# Patient Record
Sex: Female | Born: 1960
Health system: Southern US, Community
[De-identification: ages and names within clinical notes are randomized; demographics above are authoritative.]

## PROBLEM LIST (undated history)

## (undated) DIAGNOSIS — E0789 Other specified disorders of thyroid: Secondary | ICD-10-CM

## (undated) DIAGNOSIS — L52 Erythema nodosum: Secondary | ICD-10-CM

## (undated) DIAGNOSIS — K219 Gastro-esophageal reflux disease without esophagitis: Secondary | ICD-10-CM

## (undated) HISTORY — DX: Gastro-esophageal reflux disease without esophagitis: K21.9

## (undated) HISTORY — DX: Erythema nodosum: L52

## (undated) HISTORY — DX: Other specified disorders of thyroid: E07.89

## (undated) HISTORY — PX: NO PAST SURGERIES: SHX2092

---

## 2007-11-10 ENCOUNTER — Encounter: Admission: RE | Admit: 2007-11-10 | Discharge: 2007-11-10 | Payer: Self-pay | Admitting: Obstetrics and Gynecology

## 2009-02-28 ENCOUNTER — Ambulatory Visit (HOSPITAL_COMMUNITY): Admission: RE | Admit: 2009-02-28 | Discharge: 2009-02-28 | Payer: Self-pay | Admitting: Family Medicine

## 2010-07-19 ENCOUNTER — Emergency Department (HOSPITAL_COMMUNITY): Admission: EM | Admit: 2010-07-19 | Discharge: 2010-07-19 | Payer: Self-pay | Admitting: Emergency Medicine

## 2013-12-11 ENCOUNTER — Ambulatory Visit (INDEPENDENT_AMBULATORY_CARE_PROVIDER_SITE_OTHER): Payer: Managed Care, Other (non HMO) | Admitting: Family Medicine

## 2013-12-11 ENCOUNTER — Encounter: Payer: Self-pay | Admitting: Family Medicine

## 2013-12-11 VITALS — BP 128/84 | Temp 98.4°F | Ht 65.0 in | Wt 147.0 lb

## 2013-12-11 DIAGNOSIS — R350 Frequency of micturition: Secondary | ICD-10-CM

## 2013-12-11 DIAGNOSIS — R300 Dysuria: Secondary | ICD-10-CM

## 2013-12-11 LAB — POCT URINALYSIS DIPSTICK
Nitrite, UA: POSITIVE
PH UA: 6.5
Spec Grav, UA: <=1.005

## 2013-12-11 MED ORDER — CIPROFLOXACIN HCL 500 MG PO TABS: 500.0000 mg | ORAL_TABLET | Freq: Two times a day (BID) | ORAL | Status: AC

## 2013-12-11 NOTE — Progress Notes (Signed)
   Subjective:    Patient ID: Karina Heath, female    DOB: April 20, 1961, 53 y.o.   MRN: [id number]  Urinary Frequency  This is a new problem. Episode onset: Wednesday. The patient is experiencing no pain. Associated symptoms include frequency. Treatments tried: AZO. The treatment provided no relief.   Symptoms began earlier today  Review of Systems  Genitourinary: Positive for frequency.   Denies severe dysuria relate some lower pelvic pressure and pain denies sweats chills nausea vomiting    Objective:   Physical Exam   lungs clear heart regular flanks nontender lower abdomen minimal tendrness  Urinalysis with wbc's    Assessment & Plan:  Urinary tract infection-antibiotics twice daily for next 5 days if it reoccurs may need retreatment if high fevers severe pain or worse followup

## 2013-12-14 ENCOUNTER — Ambulatory Visit: Payer: Self-pay | Admitting: Family Medicine

## 2014-10-18 ENCOUNTER — Ambulatory Visit (INDEPENDENT_AMBULATORY_CARE_PROVIDER_SITE_OTHER): Payer: Managed Care, Other (non HMO) | Admitting: Nurse Practitioner

## 2014-10-18 ENCOUNTER — Encounter: Payer: Self-pay | Admitting: Nurse Practitioner

## 2014-10-18 VITALS — BP 124/82 | Temp 98.8°F | Ht 65.0 in | Wt 148.0 lb

## 2014-10-18 DIAGNOSIS — E875 Hyperkalemia: Secondary | ICD-10-CM

## 2014-10-18 LAB — HM MAMMOGRAPHY

## 2014-10-18 NOTE — Patient Instructions (Signed)
CT virtual colonoscopy [facility])

## 2014-10-20 ENCOUNTER — Encounter: Payer: Self-pay | Admitting: Nurse Practitioner

## 2014-10-20 NOTE — Progress Notes (Signed)
Subjective:  Presents to discuss labs done through her GYN. Also, patient had some vague intermittent weakness and tingling in extremities last week. ROS: otherwise negative. Denies any excessive intake of high potassium foods. Eats healthy diet. No CP or SOB.  Objective:   BP 124/82 mmHg  Temp(Src) 98.8 F (37.1 C)  Ht 5\' 5"  (1.651 m)  Wt 148 lb (67.132 kg)  BMI 24.63 kg/m2 NAD. Alert, oriented. Lungs clear. Heart RRR. Labs dated 08/23/14 shows elevated potassium at 6.5. According to patient she just had rechecked and level is now 5.5 (this was not included in labs she brought today). HgbA1C 5.7.    Assessment: Hyperkalemia  Plan: avoid excessive intake of foods high in potassium. Limit sugar and simple carbs in diet. Recheck HgbA1C at least once a year. Her other symptoms mentioned at end of visit are very vague. Warning signs reviewed; call back if worsens or persists. Reminded about colonoscopy.

## 2014-10-21 ENCOUNTER — Telehealth: Payer: Self-pay | Admitting: Nurse Practitioner

## 2014-10-21 NOTE — Telephone Encounter (Signed)
Pt states at her last OV with you there was a discussion about a  progesterone cream   She would like you to go ahead a call this in please   [facility]  [place]

## 2014-10-22 ENCOUNTER — Other Ambulatory Visit: Payer: Self-pay | Admitting: Nurse Practitioner

## 2014-10-22 NOTE — Telephone Encounter (Signed)
Will send in Rx but needs to be [facility] locally since they compound; [place] are other places [place]

## 2014-10-23 NOTE — Telephone Encounter (Signed)
Patient wanted it faxed to [facility]. Faxed med.

## 2015-05-07 ENCOUNTER — Encounter: Payer: Self-pay | Admitting: *Deleted

## 2016-07-14 ENCOUNTER — Emergency Department (HOSPITAL_COMMUNITY): Payer: 59

## 2016-07-14 ENCOUNTER — Encounter (HOSPITAL_COMMUNITY): Payer: Self-pay | Admitting: Emergency Medicine

## 2016-07-14 ENCOUNTER — Emergency Department (HOSPITAL_COMMUNITY)
Admission: EM | Admit: 2016-07-14 | Discharge: 2016-07-14 | Disposition: A | Discharge status: Home / Self Care | Payer: 59 | Attending: Emergency Medicine | Admitting: Emergency Medicine

## 2016-07-14 DIAGNOSIS — R0789 Other chest pain: Secondary | ICD-10-CM | POA: Diagnosis not present

## 2016-07-14 DIAGNOSIS — Z791 Long term (current) use of non-steroidal anti-inflammatories (NSAID): Secondary | ICD-10-CM | POA: Diagnosis not present

## 2016-07-14 DIAGNOSIS — R079 Chest pain, unspecified: Secondary | ICD-10-CM

## 2016-07-14 LAB — CBC WITH DIFFERENTIAL/PLATELET
BASOS ABS: 0 10*3/uL (ref 0.0–0.1)
BASOS PCT: 0 %
Eosinophils Absolute: 0.2 10*3/uL (ref 0.0–0.7)
Eosinophils Relative: 2 %
HEMATOCRIT: 45.7 % (ref 36.0–46.0)
HEMOGLOBIN: 15.3 g/dL — AB (ref 12.0–15.0)
LYMPHS PCT: 29 %
Lymphs Abs: 2.4 10*3/uL (ref 0.7–4.0)
MCH: 29.6 pg (ref 26.0–34.0)
MCHC: 33.5 g/dL (ref 30.0–36.0)
MCV: 88.4 fL (ref 78.0–100.0)
Monocytes Absolute: 0.6 10*3/uL (ref 0.1–1.0)
Monocytes Relative: 7 %
NEUTROS ABS: 5.2 10*3/uL (ref 1.7–7.7)
NEUTROS PCT: 62 %
Platelets: 324 10*3/uL (ref 150–400)
RBC: 5.17 MIL/uL — AB (ref 3.87–5.11)
RDW: 13.2 % (ref 11.5–15.5)
WBC: 8.5 10*3/uL (ref 4.0–10.5)

## 2016-07-14 LAB — BASIC METABOLIC PANEL
ANION GAP: 7 (ref 5–15)
BUN: 15 mg/dL (ref 6–20)
CALCIUM: 9.5 mg/dL (ref 8.9–10.3)
CHLORIDE: 104 mmol/L (ref 101–111)
CO2: 28 mmol/L (ref 22–32)
Creatinine, Ser: 0.87 mg/dL (ref 0.44–1.00)
GFR calc non Af Amer: >60 mL/min (ref >60)
Glucose, Bld: 83 mg/dL (ref 65–99)
POTASSIUM: 3.8 mmol/L (ref 3.5–5.1)
Sodium: 139 mmol/L (ref 135–145)

## 2016-07-14 LAB — TROPONIN I: Troponin I: <0.03 ng/mL (ref <0.03)

## 2016-07-14 MED ORDER — NITROGLYCERIN 0.4 MG SL SUBL: 0.4000 mg | SUBLINGUAL_TABLET | SUBLINGUAL | Status: DC | PRN

## 2016-07-14 MED ORDER — ASPIRIN 81 MG PO CHEW
CHEWABLE_TABLET | ORAL | Status: AC
  Filled 2016-07-14: qty 4

## 2016-07-14 MED ORDER — NITROGLYCERIN 0.4 MG SL SUBL: 0.4000 mg | SUBLINGUAL_TABLET | SUBLINGUAL | 0 refills | Status: DC | PRN

## 2016-07-14 MED ORDER — ASPIRIN 81 MG PO CHEW
324.0000 mg | CHEWABLE_TABLET | Freq: Once | ORAL | Status: AC
  Administered 2016-07-14: 324 mg via ORAL

## 2016-07-14 MED ORDER — NITROGLYCERIN 0.4 MG SL SUBL
SUBLINGUAL_TABLET | SUBLINGUAL | Status: AC
  Filled 2016-07-14: qty 1

## 2016-07-14 NOTE — ED Provider Notes (Addendum)
[facility]-EMER[facility]Y DEPT Provider Note   CSN: [id number] Arrival date & time: 07/14/16  1258     History   Chief Complaint Chief Complaint  Patient presents with  . Chest Pain    HPI [name] is a 55 y.o. female.  Patient initially presented with epigastric pain described as indigestion starting approximately 12:15 today. Later she describes central chest pressure with radiation to the right neck and mandible with tingling in her arms left greater than right. Past medical history includes hypertension for which she takes no Rx.  Brother had MI at age 55. No diaphoresis, dyspnea, nausea. Symptoms are improving.      History reviewed. No pertinent past medical history.  There are no active problems to display for this patient.   History reviewed. No pertinent surgical history.  OB History    No data available       Home Medications    Prior to Admission medications   Medication Sig Start Date End Date Taking? Authorizing Provider  Cholecalciferol (VITAMIN D) 2000 units CAPS Take 2,000 Units by mouth daily.   Yes Historical Provider, MD  ibuprofen (ADVIL,MOTRIN) 200 MG tablet Take 400 mg by mouth every 6 (six) hours as needed.   Yes Historical Provider, MD  Magnesium Oxide 250 MG TABS Take 400 mg by mouth daily.   Yes Historical Provider, MD  nitroGLYCERIN (NITROSTAT) 0.4 MG SL tablet Place 1 tablet (0.4 mg total) under the tongue every 5 (five) minutes as needed for chest pain. 07/14/16   Donnetta HutchingBrian Maximino Cozzolino, MD    Family History History reviewed. No pertinent family history.  Social History Social History  Substance Use Topics  . Smoking status: Never Smoker  . Smokeless tobacco: Never Used  . Alcohol use Yes     Comment: occa glass of wine     Allergies   Review of patient's allergies indicates no known allergies.   Review of Systems Review of Systems  All other systems reviewed and are negative.    Physical Exam Updated Vital Signs BP 146/87   Pulse  68   Resp 16   Ht 5\' 5"  (1.651 m)   Wt 140 lb (63.5 kg)   SpO2 99%   BMI 23.30 kg/m   Physical Exam  Constitutional: She is oriented to person, place, and time. She appears well-developed and well-nourished.  HENT:  Head: Normocephalic and atraumatic.  Eyes: Conjunctivae are normal.  Neck: Neck supple.  Cardiovascular: Normal rate and regular rhythm.   Pulmonary/Chest: Effort normal and breath sounds normal.  Abdominal: Soft. Bowel sounds are normal.  Musculoskeletal: Normal range of motion.  Neurological: She is alert and oriented to person, place, and time.  Skin: Skin is warm and dry.  Psychiatric: She has a normal mood and affect. Her behavior is normal.  Nursing note and vitals reviewed.    ED Treatments / Results  Labs (all labs ordered are listed, but only abnormal results are displayed) Labs Reviewed  CBC WITH DIFFERENTIAL/PLATELET - Abnormal; Notable for the following:       Result Value   RBC 5.17 (*)    Hemoglobin 15.3 (*)    All other components within normal limits  BASIC METABOLIC PANEL  TROPONIN I    EKG  EKG Interpretation  Date/Time:  Wednesday July 14 2016 13:08:51 EDT Ventricular Rate:  75 PR Interval:    QRS Duration: 94 QT Interval:  379 QTC Calculation: 424 R Axis:   69 Text Interpretation:  Sinus rhythm No old tracing  to compare Confirmed by KNAPP  MD-J, JON [phone]) on 07/14/2016 1:16:35 PM       Radiology Dg Chest 2 View  Result Date: 07/14/2016 CLINICAL DATA:  Chest tightness with radiation into the neck, initial encounter EXAM: CHEST  2 VIEW COMPARISON:  None. FINDINGS: The heart size and mediastinal contours are within normal limits. Both lungs are clear. The visualized skeletal structures are unremarkable. IMPRESSION: No active cardiopulmonary disease. Electronically Signed   By: Alcide Clever M.D.   On: 07/14/2016 13:43    Procedures Procedures (including critical care time)  Medications Ordered in ED Medications    nitroGLYCERIN (NITROSTAT) SL tablet 0.4 mg (not administered)  aspirin 81 MG chewable tablet (not administered)  aspirin chewable tablet 324 mg (324 mg Oral Given 07/14/16 1314)     Initial Impression / Assessment and Plan / ED Course  I have reviewed the triage vital signs and the nursing notes.  Pertinent labs & imaging results that were available during my care of the patient were reviewed by me and considered in my medical decision making (see chart for details).  Clinical Course    Disc c cardiologist Dr Purvis Sheffield.  Screening tests including EKG, chest x-ray, troponin all negative. Will follow-up as outpatient. Prescription for nitroglycerin given.  Final Clinical Impressions(s) / ED Diagnoses   Final diagnoses:  Chest pain, unspecified type    New Prescriptions New Prescriptions   NITROGLYCERIN (NITROSTAT) 0.4 MG SL TABLET    Place 1 tablet (0.4 mg total) under the tongue every 5 (five) minutes as needed for chest pain.     Donnetta Hutching, MD 07/14/16 1532    Donnetta Hutching, MD 07/14/16 [id number]

## 2016-07-14 NOTE — Discharge Instructions (Signed)
Tests were good. Your blood pressure was elevated earlier today. Recommend follow-up with cardiologist. Phone number given. Prescription for nitroglycerin. Return if worse.

## 2016-07-14 NOTE — ED Triage Notes (Signed)
Pt had sudden pressure to central chest that radiated into neck and both jaws and into back. Pt states since she has been here pain is only to central chest rating pressure 1. Pt states has been having more than usuall 'hot flashes" today. nondiaphoretic at this time

## 2016-07-14 NOTE — ED Triage Notes (Signed)
Tingling to left arm at present

## 2016-07-15 ENCOUNTER — Emergency Department (HOSPITAL_COMMUNITY)
Admission: EM | Admit: 2016-07-15 | Discharge: 2016-07-15 | Disposition: A | Discharge status: Home / Self Care | Payer: 59 | Attending: Emergency Medicine | Admitting: Emergency Medicine

## 2016-07-15 ENCOUNTER — Encounter (HOSPITAL_COMMUNITY): Payer: Self-pay | Admitting: Emergency Medicine

## 2016-07-15 DIAGNOSIS — Z79899 Other long term (current) drug therapy: Secondary | ICD-10-CM | POA: Diagnosis not present

## 2016-07-15 DIAGNOSIS — R202 Paresthesia of skin: Secondary | ICD-10-CM | POA: Diagnosis not present

## 2016-07-15 DIAGNOSIS — R200 Anesthesia of skin: Secondary | ICD-10-CM | POA: Diagnosis present

## 2016-07-15 DIAGNOSIS — Z791 Long term (current) use of non-steroidal anti-inflammatories (NSAID): Secondary | ICD-10-CM | POA: Diagnosis not present

## 2016-07-15 NOTE — ED Notes (Signed)
MD at bedside. 

## 2016-07-15 NOTE — ED Triage Notes (Signed)
Pt seen here yesterday for chest pain with radiation into her jaws and back. Pt states she has had "some of the same today." Pt c/o L arm numbness and tingling that started a couple of hours ago. Pt able to move her arm. Pt reports elevated BP today.

## 2016-07-15 NOTE — ED Provider Notes (Signed)
[facility] DE[facility] Provider Note   CSN: [id number] Arrival date & time: 07/15/16  1422     History   Chief Complaint Chief Complaint  Patient presents with  . Numbness    HPI Karina Heath is a 55 y.o. female.  She is here for evaluation of a recurrent episode of left arm numbness, which occurred while she was working on a computer at work today. 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 Because of that, she decided to come here for evaluation. She had been in the emergency department yesterday with an episode of chest discomfort radiating to her bilateral jaws and numbness in her left arm, all of which lasted about 30 minutes. On arrival to the ED, yesterday she was hypertensive, and it gradually improved, to normal, during the evaluation, spontaneously. She filled the prescription for nitroglycerin yesterday, but has not used it. She was not having chest pain earlier today when she had the numb feeling. Yesterday, the chest pain was coincident with the left arm numbness. She denies headache, neck pain, back pain, difficulty walking, dizziness or near syncope. There's been no blurred vision. No similar preceding problems before yesterday. She does not have chronic hypertension. She has never had any cardiac disease. She has been referred to cardiology, but the first appointment, she was able to schedule a couple weeks. There are no other known modifying factors.   HPI  History reviewed. No pertinent past medical history.  There are no active problems to display for this patient.   History reviewed. No pertinent surgical history.  OB History    Gravida Para Term Preterm AB Living   2 2 2          SAB TAB Ectopic Multiple Live Births                   Home Medications    Prior to Admission medications   Medication Sig Start Date End Date Taking? Authorizing Provider  cholecalciferol (VITAMIN D) 1000 units tablet Take 2,000  Units by mouth at bedtime.   Yes Historical Provider, MD  ibuprofen (ADVIL,MOTRIN) 200 MG tablet Take 400 mg by mouth every 6 (six) hours as needed for headache, mild pain or moderate pain.    Yes Historical Provider, MD  Magnesium 250 MG TABS Take 250 mg by mouth at bedtime.   Yes Historical Provider, MD  nitroGLYCERIN (NITROSTAT) 0.4 MG SL tablet Place 1 tablet (0.4 mg total) under the tongue every 5 (five) minutes as needed for chest pain. 07/14/16  Yes Donnetta Hutching, MD    Family History Family History  Problem Relation Age of Onset  . Heart attack Brother     Social History Social History  Substance Use Topics  . Smoking status: Never Smoker  . Smokeless tobacco: Never Used  . Alcohol use Yes     Comment: occa glass of wine     Allergies   Review of patient's allergies indicates no known allergies.   Review of Systems Review of Systems  All other systems reviewed and are negative.    Physical Exam Updated Vital Signs BP 149/86 (BP Location: Right Arm)   Pulse 77   Temp 97.9 F (36.6 C) (Oral)   Resp 18   Ht 5\' 5"  (1.651 m)   Wt 140 lb (63.5 kg)   SpO2 97%   BMI 23.30 kg/m   Physical Exam  Constitutional: She is oriented to person, place, and  time. She appears well-developed and well-nourished.  HENT:  Head: Normocephalic and atraumatic.  Eyes: Conjunctivae and EOM are normal. Pupils are equal, round, and reactive to light.  Neck: Normal range of motion and phonation normal. Neck supple.  Cardiovascular: Normal rate and regular rhythm.   Pulmonary/Chest: Effort normal and breath sounds normal. No respiratory distress. She exhibits no tenderness.  Abdominal: Soft. She exhibits no distension. There is no tenderness. There is no guarding.  Musculoskeletal: Normal range of motion.  Neurological: She is alert and oriented to person, place, and time. She exhibits normal muscle tone.  No dysarthria and aphasia or nystagmus. Normal light touch sensation in arms and  legs bilaterally.  Skin: Skin is warm and dry.  Psychiatric: She has a normal mood and affect. Her behavior is normal. Judgment and thought content normal.  Nursing note and vitals reviewed.    ED Treatments / Results  Labs (all labs ordered are listed, but only abnormal results are displayed) Labs Reviewed - No data to display  EKG  EKG Interpretation  Date/Time:  Thursday July 15 2016 14:31:45 EDT Ventricular Rate:  81 PR Interval:  150 QRS Duration: 74 QT Interval:  352 QTC Calculation: 408 R Axis:   64 Text Interpretation:  Normal sinus rhythm Right atrial enlargement Cannot rule out Anterior infarct , age undetermined Abnormal ECG since last tracing no significant change Confirmed by Effie ShyWENTZ  MD, Randal Yepiz [phone]([id number]) on 07/15/2016 2:55:32 PM       Radiology Dg Chest 2 View  Result Date: 07/14/2016 CLINICAL DATA:  Chest tightness with radiation into the neck, initial encounter EXAM: CHEST  2 VIEW COMPARISON:  None. FINDINGS: The heart size and mediastinal contours are within normal limits. Both lungs are clear. The visualized skeletal structures are unremarkable. IMPRESSION: No active cardiopulmonary disease. Electronically Signed   By: Alcide CleverMark  Lukens M.D.   On: 07/14/2016 13:43    Procedures Procedures (including critical care time)  Medications Ordered in ED Medications - No data to display   Initial Impression / Assessment and Plan / ED Course  I have reviewed the triage vital signs and the nursing notes.  Pertinent labs & imaging results that were available during my care of the patient were reviewed by me and considered in my medical decision making (see chart for details).  Clinical Course    Medications - No data to display  Patient Vitals for the past 24 hrs:  BP Temp Temp src Pulse Resp SpO2 Height Weight  07/15/16 1647 149/86 97.9 F (36.6 C) Oral 77 18 97 % - -  07/15/16 1604 135/85 - - 71 16 92 % - -  07/15/16 1428 176/81 97.7 F (36.5 C) Oral 83 20  99 % 5\' 5"  (1.651 m) 140 lb (63.5 kg)    At D/C Reevaluation with update and discussion. After initial assessment and treatment, an updated evaluation reveals patient is comfortable and denies paresthesia. At this time. Findings discussed with family and patient, all questions answered. At their request, her cardiology appointment was advanced, to October 17, as documented. Woodrow Dulski L    Final Clinical Impressions(s) / ED Diagnoses   Final diagnoses:  Paresthesia   Nonspecific chest pain and paresthesias, with episodic hypertension.  Doubt hypertensive  urgency, CVA, ACS, metabolic instability or impending vascular collapse.  Nursing Notes Reviewed/ Care Coordinated Applicable Imaging Reviewed Interpretation of Laboratory Data incorporated into ED treatment  The patient appears reasonably screened and/or stabilized for discharge and I doubt any other medical condition or  other EMC requiring further screening, evaluation, or treatment in the ED at this time prior to discharge.  Plan: Home Medications- continue; Home Treatments- rest; return here if the recommended treatment, does not improve the symptoms; Recommended follow up- PCP prn   New Prescriptions Discharge Medication List as of 07/15/2016  4:40 PM       Mancel Bale, MD 07/15/16 1704

## 2016-07-15 NOTE — Discharge Instructions (Signed)
Follow[place], [place]ologist[place], as scheduled.  Try to avoid stressful situations.  Since your blood pressure has been elevated occasionally, try to watch her salt intake.  Return here or see the doctor of your choice as needed, for problems.

## 2016-07-20 ENCOUNTER — Encounter: Payer: Self-pay | Admitting: Cardiology

## 2016-07-20 ENCOUNTER — Ambulatory Visit (INDEPENDENT_AMBULATORY_CARE_PROVIDER_SITE_OTHER): Payer: 59 | Admitting: Cardiology

## 2016-07-20 VITALS — BP 144/88 | Ht 65.0 in | Wt 148.0 lb

## 2016-07-20 DIAGNOSIS — R079 Chest pain, unspecified: Secondary | ICD-10-CM | POA: Diagnosis not present

## 2016-07-20 NOTE — Patient Instructions (Addendum)
Medication Instructions:  Your physician recommends that you continue on your current medications as directed. Please refer to the Current Medication list given to you today.   Labwork: None ordered  Testing/Procedures: Your physician has requested that you have an exercise tolerance test. For further information please visit [email or web address]. Please also follow instruction sheet, as given.    Follow-Up: Your physician recommends that you schedule a follow-up appointment in: 2-3 WEEKS IN THE [place] OFFICE WITH 1ST AVAILABLE   Any Other Special Instructions Will Be Listed Below (If Applicable).   Exercise Stress Electrocardiogram An exercise stress electrocardiogram is a test to check how blood flows to your heart. It is done to find areas of poor blood flow. You will need to walk on a treadmill for this test. The electrocardiogram will record your heartbeat when you are at rest and when you are exercising. BEFORE THE PROCEDURE  Do not have drinks with caffeine or foods with caffeine for 24 hours before the test, or as told by your doctor. This includes coffee, tea (even decaf tea), sodas, chocolate, and cocoa.  Follow your doctor's instructions about eating and drinking before the test.  Ask your doctor what medicines you should or should not take before the test. Take your medicines with water unless told by your doctor not to.  If you use an inhaler, bring it with you to the test.  Bring a snack to eat after the test.  Do not  smoke for 4 hours before the test.  Do not put lotions, powders, creams, or oils on your chest before the test.  Wear comfortable shoes and clothing. PROCEDURE  You will have patches put on your chest. Small areas of your chest may need to be shaved. Wires will be connected to the patches.  Your heart rate will be watched while you are resting and while you are exercising.  You will walk on the treadmill. The treadmill will slowly get faster  to raise your heart rate.  The test will take about 1-2 hours. AFTER THE PROCEDURE  Your heart rate and blood pressure will be watched after the test.  You may return to your normal diet, activities, and medicines or as told by your doctor.   This information is not intended to replace advice given to you by your health care provider. Make sure you discuss any questions you have with your health care provider.   Document Released: 03/08/2008 Document Revised: 10/11/2014 Document Reviewed: 05/28/2013 Elsevier Interactive Patient Education [employer]2016 [employer].    If you need a refill on your cardiac medications before your next appointment, please call your pharmacy.

## 2016-07-20 NOTE — Progress Notes (Signed)
07/20/2016 Karina Heath   28-Sep-1961  [id number]  Primary Physician Lilyan PuntScott Luking, MD Primary Cardiologist: New (Seen by Dr. Ladona Ridgelaylor, DOD)   Reason for Visit/CC: New Patient evaluation for chest pain   HPI:  The patient is a 55 year old postmenopausal female, who presents to clinic today as a new patient for evaluation for chest pain. She has been referred by the [facility] emergency department. She has no known cardiac disease. Her risk factors include prior history of hypertension. She reports that she was once on medications for this, however she was able to get her blood pressure under control through diet and exercise. She is no longer on any medications for HTN. She denies any prior history of diabetes or hyperlipidemia. No history of tobacco use. She does note a significant family history of coronary artery disease. Her brother had a myocardial infarction at the age of 55. He was a smoker. 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 Her husband is a former EMT.  On October 12, while at rest, she developed sudden onset of substernal chest pressure/heaviness. She initially thought that it was indigestion. It then radiated higher up into her anterior neck, jaw, and mid scapular area. It then progressed down the length of her left arm. She denied any associated dyspnea. No diaphoresis, nausea or vomiting. She had her husband drive her to the [facility] emergency department. On arrival, her pressure was noted to be high in the 180s systolic. She was given 4 baby aspirin and her pain resolved. Workup in the ED included negative enzymes and normal EKG. Since that time, she has had recurrent symptoms of left arm numbness that occurs intermittently. She denies any exertional components. At the [facility] ED, they sent her home with sublingual nitroglycerin however she reports that she has not had to use any of this. She was referred to our clinic for further  evaluation.  She is currently chest pain-free. No left arm numbness. Blood pressure is 140/90.     Current Meds  Medication Sig  . cholecalciferol (VITAMIN D) 1000 units tablet Take 2,000 Units by mouth at bedtime.  Marland Kitchen. ibuprofen (ADVIL,MOTRIN) 200 MG tablet Take 400 mg by mouth every 6 (six) hours as needed for headache, mild pain or moderate pain.   . Magnesium 250 MG TABS Take 250 mg by mouth at bedtime.  . nitroGLYCERIN (NITROSTAT) 0.4 MG SL tablet Place 1 tablet (0.4 mg total) under the tongue every 5 (five) minutes as needed for chest pain.  Marland Kitchen. tretinoin (RETIN-A) 0.05 % cream Apply 1 application topically at bedtime.   No Known Allergies No past medical history on file. Family History  Problem Relation Age of Onset  . Heart attack Brother    No past surgical history on file. Social History   Social History  . Marital status: Married    Spouse name: N/A  . Number of children: N/A  . Years of education: N/A   Occupational History  . Not on file.   Social History Main Topics  . Smoking status: Never Smoker  . Smokeless tobacco: Never Used  . Alcohol use Yes     Comment: occa glass of wine  . Drug use: No  . Sexual activity: Not on file   Other Topics Concern  . Not on file   Social History Narrative  . No narrative on file     Review of Systems: General: negative for chills, fever, night  sweats or weight changes.  Cardiovascular: + for chest pain, - dyspnea on exertion, edema, orthopnea, palpitations, paroxysmal nocturnal dyspnea or shortness of breath Dermatological: negative for rash Respiratory: negative for cough or wheezing Urologic: negative for hematuria Abdominal: negative for nausea, vomiting, diarrhea, bright red blood per rectum, melena, or hematemesis Neurologic: negative for visual changes, syncope, or dizziness All other systems reviewed and are otherwise negative except as noted above.   Physical Exam:  Blood pressure (!) 144/88, height 5\' 5"   (1.651 m), weight 148 lb (67.1 kg).  General appearance: alert, cooperative and no distress Neck: no carotid bruit and no JVD Lungs: clear to auscultation bilaterally Heart: regular rate and rhythm, S1, S2 normal, no murmur, click, rub or gallop Extremities: no LEE Pulses: 2+ and symmetric Skin: warm and dry Neurologic: Grossly normal  EKG NSR. No ischemia.   ASSESSMENT AND PLAN:   1. Chest Pain with Moderate Risk for Cardiac Etiology: Patient has had recent symptoms of substernal chest pressure/tightness radiating to her neck, jaw ,mid scapular area and left arm. The symptoms have occurred at rest. Cardiac risk factors include a history of hypertension as well as a family history of premature CAD in first-degree relatives. Her brother had a myocardial infarction at the age of 64. She denies any known history of diabetes, hyperlipidemia, nor tobacco use. Recent workup in the [facility] ED was unremarkable including negative enzymes and EKG. She is currently chest pain-free. Her blood pressure is better controlled today but still slightly elevated at 140/90. She has started taking low-dose baby aspirin daily. She  has sublingual nitroglycerin tablets at home to use if needed. I discussed case with the DOD, Dr. Ladona Ridgel. We have elected to further evaluate the patient with an exercise tolerance test to assess for coronary ischemia. The patient lives in [place]. We will have this completed at our [place] location. She will follow-up in 2 with a provider at that office for repeat assessment and to review stress test results. Her BP will need to be reassessed. If still elevated, she may need to restart a antihypertensive.     Robbie Lis PA-C 07/20/2016 12:15 PM

## 2016-07-23 ENCOUNTER — Ambulatory Visit (HOSPITAL_COMMUNITY)
Admission: RE | Admit: 2016-07-23 | Discharge: 2016-07-23 | Disposition: A | Discharge status: Home / Self Care | Payer: 59 | Source: Ambulatory Visit | Attending: Cardiology | Admitting: Cardiology

## 2016-07-23 DIAGNOSIS — R079 Chest pain, unspecified: Secondary | ICD-10-CM | POA: Diagnosis not present

## 2016-07-23 LAB — EXERCISE TOLERANCE TEST
CSEPED: 6 min
CSEPEDS: 0 s
CSEPEW: 7 METS
CSEPHR: 96 %
MPHR: 165 {beats}/min
Peak HR: 160 {beats}/min
RPE: 15
Rest HR: 66 {beats}/min

## 2016-07-26 ENCOUNTER — Encounter: Payer: Self-pay | Admitting: Family Medicine

## 2016-07-26 ENCOUNTER — Ambulatory Visit (INDEPENDENT_AMBULATORY_CARE_PROVIDER_SITE_OTHER): Payer: 59 | Admitting: Family Medicine

## 2016-07-26 VITALS — BP 138/82 | Ht 65.0 in | Wt 145.4 lb

## 2016-07-26 DIAGNOSIS — R200 Anesthesia of skin: Secondary | ICD-10-CM

## 2016-07-26 MED ORDER — PANTOPRAZOLE SODIUM 40 MG PO TBEC: 40.0000 mg | DELAYED_RELEASE_TABLET | Freq: Every day | ORAL | 3 refills | Status: DC

## 2016-07-26 NOTE — Progress Notes (Signed)
   Subjective:    Patient ID: Karina Heath, female    DOB: [birth year], 55 y.o.   MRN: [id number]  HPI Patient in today with left sided numbness, pressure in chest and back. Has been to ER and seen cardiologist.  This patient stated that approximately 2 weeks ago she had some chest discomfort heaviness tightness shortness of breath and left arm tingling she went to the ER was evaluated on 2 separate days. Cardiology did a stress test and told her that they felt her heart was okay she continues to have chest discomfort but over the past couple days is had tingling and numbness in the left arm left sided face and in her left leg she describes this as a pins and needle sensation and she describes a little bit of weakness in the left arm although none today. She denies any previous trouble with this. She is worried about this. She denies being under stress. Patient does not smoke or drink States no other concerns this visit.    Review of Systems She does relate some chest discomfort occasional shortness of breath denies sweats chills denies headaches she does relate left side numbness and tingling and some left arm weakness    Objective:   Physical Exam Vital signs good blood pressure elevated for her Lungs are clear no crackles heart regular no murmurs reflexes in the bicep and wrist are normal strength subjective weakness in the left arm but overall test fine finger to nose normal Romberg negative HEENT benign Cranial nerves II through XII normal       Assessment & Plan:  Intermittent chest pain-so far cardiac evaluation negative if ongoing troubles they may need to do additional testing they will be seeing her next week as follow-up  Although I doubt that there is reflux we need to cover this with a PPI until further workup  Patient with left side numbness she should continue a baby aspirin daily. Also recommend that the patient be seen by neurology urgently. Plus also I recommend MRI of the  brain and carotid ultrasound. Although I do not find any evidence of a stroke we are trying to rule out the possibility of TIA/mini strokes/MS. Further evaluation based upon these results and neurology consultation patient may need referral to gastroenterology as well

## 2016-07-29 ENCOUNTER — Encounter: Payer: Self-pay | Admitting: Family Medicine

## 2016-07-30 ENCOUNTER — Ambulatory Visit: Payer: 59 | Admitting: Cardiology

## 2016-07-30 ENCOUNTER — Telehealth: Payer: Self-pay | Admitting: Family Medicine

## 2016-07-30 DIAGNOSIS — E041 Nontoxic single thyroid nodule: Secondary | ICD-10-CM

## 2016-07-30 NOTE — Telephone Encounter (Signed)
Review Brain MRI results in results folder.

## 2016-07-30 NOTE — Telephone Encounter (Signed)
Patient's carotid arteries look good. Patient's MRI looks good. This is reassuring. Still needs to see neurology the referral was sent. The patient should hear somewhere within the next week when her appointment is if she has not please let us know. Also carotid ultrasound showed incidental thyroid nodule. This does not necessarily mean that this is cancer but it does need to get looked into It is important for the patient to have a ultrasound dedicated for the thyroid gland. Please set up tyroid ultrasound.

## 2016-07-30 NOTE — Telephone Encounter (Signed)
Review US Caratoid Bilateral results from [facility].

## 2016-08-02 NOTE — Telephone Encounter (Signed)
Discussed with pt. Pt would like to hold off on referral for now since it may be the nodule causing her issues. Pt states they already did a thyroid ultrasound. She states they did it without an order since they saw the nodule. She states they told her it would be included with the report.

## 2016-08-03 ENCOUNTER — Ambulatory Visit: Payer: 59 | Admitting: Adult Health

## 2016-08-03 ENCOUNTER — Encounter: Payer: Self-pay | Admitting: Family Medicine

## 2016-08-03 NOTE — Telephone Encounter (Signed)
The thyroid nodule will not cause left side numbness. I still believe it is in the patient's best interest to see a neurologist. Also the thyroid nodule should have further evaluation by ENT

## 2016-08-03 NOTE — Telephone Encounter (Signed)
Left message return call 08/03/16

## 2016-08-03 NOTE — Telephone Encounter (Signed)
An endocrinologist can also handle this issue. [facility] fine.

## 2016-08-03 NOTE — Telephone Encounter (Signed)
Pt states she has appt with neuology. Wanted to clarify if you wanted her to see endocrinologist or ENT. I told pt ENT but she thought she should see endocrinologist. She has seen dr Everardo AllEllison in the past but it was about 20 years ago

## 2016-08-03 NOTE — Telephone Encounter (Signed)
disucssed with pt. Order for referral put in to endocrinologist dr Everardo Allellison

## 2016-08-05 ENCOUNTER — Telehealth: Payer: Self-pay | Admitting: Family Medicine

## 2016-08-05 NOTE — Telephone Encounter (Signed)
Dr Fransico HimNida is a good endocrinologist he can certainly handle this issue in regards to recommending treatment and whether or not what type of testing is necessary

## 2016-08-05 NOTE — Telephone Encounter (Signed)
Wanting to know what Dr. Roby LoftsScott's opinion is on [facility].  She was referred to [place]ocrin[place] in [place], but she thinks [place] may be better for her work schedule.

## 2016-08-06 NOTE — Telephone Encounter (Signed)
Spoke with patient and informed her per Dr.Scott Luking- Dr Fransico HimNida is a good endocrinologist he can certainly handle this issue in regards to recommending treatment and whether or not what type of testing is necessary. Patient verbalized understanding.

## 2016-08-10 ENCOUNTER — Encounter: Payer: Self-pay | Admitting: Family Medicine

## 2016-08-10 ENCOUNTER — Encounter: Payer: 59 | Admitting: Cardiovascular Disease

## 2016-08-13 ENCOUNTER — Ambulatory Visit: Payer: 59 | Admitting: Adult Health

## 2016-08-15 NOTE — Progress Notes (Signed)
Subjective:    Patient ID: Karina Heath, female    DOB: [birth year], 55 y.o.   MRN: [id number]  HPI Pt is referred by Dr Gerda DissLuking for nodular thyroid.  She says she was rx'ed for hyperthyroidism in the 1990's.  She went off rx after a few mos, and has been euthyroid since.  A few weeks ago, pt had severe facial pain, and assoc neck swelling.  She had an MRI of the face, and was incidentally noted to have thyroid nodule.  she has no h/o XRT or surgery to the neck.   Past Medical History:  Diagnosis Date  . GERD (gastroesophageal reflux disease)     No past surgical history on file.  Social History   Social History  . Marital status: Married    Spouse name: N/A  . Number of children: N/A  . Years of education: N/A   Occupational History  . Not on file.   Social History Main Topics  . Smoking status: Never Smoker  . Smokeless tobacco: Never Used  . Alcohol use Yes     Comment: occa glass of wine  . Drug use: No  . Sexual activity: Not on file   Other Topics Concern  . Not on file   Social History Narrative  . No narrative on file    Current Outpatient Prescriptions on File Prior to Visit  Medication Sig Dispense Refill  . cholecalciferol (VITAMIN D) 1000 units tablet Take 2,000 Units by mouth at bedtime.    Marland Kitchen. ibuprofen (ADVIL,MOTRIN) 200 MG tablet Take 400 mg by mouth every 6 (six) hours as needed for headache, mild pain or moderate pain.     . Magnesium 250 MG TABS Take 250 mg by mouth at bedtime.    . pantoprazole (PROTONIX) 40 MG tablet Take 1 tablet (40 mg total) by mouth daily. 30 tablet 3  . tretinoin (RETIN-A) 0.05 % cream Apply 1 application topically at bedtime.    . nitroGLYCERIN (NITROSTAT) 0.4 MG SL tablet Place 1 tablet (0.4 mg total) under the tongue every 5 (five) minutes as needed for chest pain. (Patient not taking: Reported on 08/16/2016) 30 tablet 0   No current facility-administered medications on file prior to visit.     No Known  Allergies  Family History  Problem Relation Age of Onset  . Heart attack Brother   . Thyroid disease Neg Hx     BP 133/88   Pulse 93   Ht 5\' 5"  (1.651 m)   Wt 146 lb (66.2 kg)   SpO2 98%   BMI 24.30 kg/m    Review of Systems Denies hoarseness, neck pain, diplopia, palpitations, sob, dysphagia, diarrhea, itching, easy bruising, anxiety, cold intolerance, seizure, and rhinorrhea.  She has bilat ear pain. She has slight weight gain and hot flashes.    Objective:   Physical Exam VS: see vs page GEN: no distress HEAD: head: no deformity eyes: no periorbital swelling, no proptosis external nose and ears are normal mouth: no lesion seen NECK: 3 cm right thyroid nodule, and 1 cm on the left.  CHEST WALL: no deformity LUNGS: clear to auscultation CV: reg rate and rhythm, no murmur ABD: abdomen is soft, nontender.  no hepatosplenomegaly.  not distended.  no hernia MUSCULOSKELETAL: muscle bulk and strength are grossly normal.  no obvious joint swelling.  gait is normal and steady. EXTEMITIES: no deformity.  no edema PULSES: no carotid bruit NEURO:  cn 2-12 grossly intact.   readily moves all 4's.  sensation is intact to touch on all 4's SKIN:  Normal texture and temperature.  No rash or suspicious lesion is visible.   NODES:  None palpable at the neck PSYCH: alert, well-oriented.  Does not appear anxious nor depressed.  I have reviewed outside records, and summarized:  Pt seen for left facial numbness, and he was ref to neurol.    Lab Results  Component Value Date   TSH 1.66 08/16/2016      Assessment & Plan:  Multinodular goiter, new. Hyperthyroidism, resolved Facial pain, not thyroid-related.  Patient is advised the following: I advised bx

## 2016-08-16 ENCOUNTER — Ambulatory Visit (INDEPENDENT_AMBULATORY_CARE_PROVIDER_SITE_OTHER): Payer: 59 | Admitting: Endocrinology

## 2016-08-16 ENCOUNTER — Encounter: Payer: Self-pay | Admitting: Endocrinology

## 2016-08-16 DIAGNOSIS — E042 Nontoxic multinodular goiter: Secondary | ICD-10-CM | POA: Insufficient documentation

## 2016-08-16 DIAGNOSIS — K219 Gastro-esophageal reflux disease without esophagitis: Secondary | ICD-10-CM

## 2016-08-16 LAB — TSH: TSH: 1.66 u[IU]/mL (ref 0.35–4.50)

## 2016-08-16 LAB — T4, FREE: FREE T4: 0.69 ng/dL (ref 0.60–1.60)

## 2016-08-16 NOTE — Patient Instructions (Addendum)
Thyroid blood tests are requested for you today.  We'll let you know about the results. If your thyroid is overactive, it is probably due to the nodules, and you should consider the radioactive iodine pill.  It works like this: We would first check a thyroid "scan" (a special, but easy and painless type of thyroid x ray).  you go to the x-ray department of the hospital to swallow a pill, which contains a miniscule amount of radiation.  You will not notice any symptoms from this.  You will go back to the x-ray department the next day, to lie down in front of a camera.  The results of this will be sent to me.   Based on the results, i hope to order for you a treatment pill of radioactive iodine.  Although it is a larger amount of radiation, you will again notice no symptoms from this.  The pill is gone from your body in a few days (during which you should stay away from other people), but takes several months to work.  Therefore, please return here approximately 6-8 weeks after the treatment.  This treatment has been available for many years, and the only known side-effect is an underactive thyroid.  It is possible that i would eventually prescribe for you a thyroid hormone pill, which is very inexpensive.  You don't have to worry about side-effects of this thyroid hormone pill, because it is the same molecule your thyroid makes. If not, you should come back for a biopsy.

## 2016-08-17 ENCOUNTER — Encounter: Payer: Self-pay | Admitting: Endocrinology

## 2016-08-17 DIAGNOSIS — K219 Gastro-esophageal reflux disease without esophagitis: Secondary | ICD-10-CM | POA: Insufficient documentation

## 2016-08-20 ENCOUNTER — Encounter: Payer: Self-pay | Admitting: Endocrinology

## 2016-08-20 ENCOUNTER — Ambulatory Visit (INDEPENDENT_AMBULATORY_CARE_PROVIDER_SITE_OTHER): Payer: 59 | Admitting: Endocrinology

## 2016-08-20 ENCOUNTER — Other Ambulatory Visit (HOSPITAL_COMMUNITY)
Admission: RE | Admit: 2016-08-20 | Discharge: 2016-08-20 | Disposition: A | Discharge status: Home / Self Care | Payer: 59 | Source: Ambulatory Visit | Attending: Endocrinology | Admitting: Endocrinology

## 2016-08-20 VITALS — BP 124/78 | HR 94 | Ht 65.0 in | Wt 145.0 lb

## 2016-08-20 DIAGNOSIS — E042 Nontoxic multinodular goiter: Secondary | ICD-10-CM | POA: Diagnosis not present

## 2016-08-20 NOTE — Patient Instructions (Signed)
We'll let you know about the biopsy results.  If as expected, no cancer is found, please come back for a follow-up appointment in 6 months  

## 2016-08-20 NOTE — Progress Notes (Signed)
   Subjective:    Patient ID: Karina Heath, female    DOB: 07/06/1961, 55 y.o.   MRN: [id number]  HPI    Review of Systems     Objective:   Physical Exam  thyroid needle bx: consent obtained, signed form on chart The areas are first sprayed with cooling agent local: xylocaine 2%, with epinephrine prep: alcohol pad 3 left lobe bxs, and 4 right lobe bxs are done, all with 27g needles no complications       Assessment & Plan:

## 2016-09-02 ENCOUNTER — Telehealth: Payer: Self-pay | Admitting: Neurology

## 2016-09-02 NOTE — Telephone Encounter (Signed)
I have received a report of the carotid Doppler study done on 07/29/2016, study was within normal limits. A 2.6 cm right thyroid lobe nodule was noted.

## 2016-09-03 ENCOUNTER — Ambulatory Visit (INDEPENDENT_AMBULATORY_CARE_PROVIDER_SITE_OTHER): Payer: 59 | Admitting: Neurology

## 2016-09-03 ENCOUNTER — Encounter: Payer: Self-pay | Admitting: Neurology

## 2016-09-03 VITALS — BP 148/94 | HR 69 | Ht 65.0 in | Wt 146.0 lb

## 2016-09-03 DIAGNOSIS — R202 Paresthesia of skin: Secondary | ICD-10-CM

## 2016-09-03 NOTE — Progress Notes (Signed)
Reason for visit: Left-sided paresthesias  Referring physician: Dr. [name]  [facility] 55 [facility]   History of present illness:  Karina Heath is a [age] year old right-handed white female with a history of onset of chest pain and discomfort going up into the neck bilaterally, and paresthesias into the left arm that began spontaneously around 07/14/2016. The patient went to the emergency room and was evaluated. The patient had a cardiac workup that was unremarkable, a carotid Doppler study was unremarkable, and she had MRI of the brain that was normal. The patient has had persistence of symptoms with upper chest pain in the midline, and some discomfort into the interscapular areas bilaterally. The patient has reported some evolution of numbness on the left face, the numbness continues down the left arm associated with a weak or clumsy sensation of the left hand. She has also developed some numbness of the left leg. She denies any balance problems, she has not had any difficulty controlling the bowels or the bladder. She does not relate any pain or paresthesias to head or neck movement. She does have a history of migraine headaches that may occur on occasion. She has not had any headache recently. She has some hypersensitivity of the skin at times. She is sent to this office for further evaluation.  Past Medical History:  Diagnosis Date  . Erythema nodosum   . GERD (gastroesophageal reflux disease)   . Other specified disorders of thyroid    Inflamed thyroid    Past Surgical History:  Procedure Laterality Date  . NO PAST SURGERIES      Family History  Problem Relation Age of Onset  . Heart attack Brother   . Heart failure Maternal Grandmother   . Heart attack Maternal Grandfather   . Thyroid disease Neg Hx     Social history:  reports that she has never smoked. She has never used smokeless tobacco. She reports that she drinks alcohol. She reports that she does not use  drugs.  Medications:  Prior to Admission medications   Medication Sig Start Date End Date Taking? Authorizing Provider  cholecalciferol (VITAMIN D) 1000 units tablet Take 2,000 Units by mouth at bedtime.   Yes Historical Provider, MD  ibuprofen (ADVIL,MOTRIN) 200 MG tablet Take 400 mg by mouth every 6 (six) hours as needed for headache, mild pain or moderate pain.    Yes Historical Provider, MD  Magnesium 250 MG TABS Take 250 mg by mouth at bedtime.   Yes Historical Provider, MD  pantoprazole (PROTONIX) 40 MG tablet Take 1 tablet (40 mg total) by mouth daily. Patient taking differently: Take 40 mg by mouth daily as needed.  07/26/16  Yes Babs SciaraScott A Luking, MD  tretinoin (RETIN-A) 0.05 % cream Apply 1 application topically at bedtime.   Yes Historical Provider, MD  nitroGLYCERIN (NITROSTAT) 0.4 MG SL tablet Place 1 tablet (0.4 mg total) under the tongue every 5 (five) minutes as needed for chest pain. Patient not taking: Reported on 09/03/2016 07/14/16   Donnetta HutchingBrian Cook, MD     No Known Allergies  ROS:  Out of a complete 14 system review of symptoms, the patient complains only of the following symptoms, and all other reviewed systems are negative.  Fatigue Chest pain Dizziness Skin rash Cough Easy bruising Feeling hot, cold Muscle cramps, aching muscles Headache, numbness, weakness, dizziness Decreased energy  Blood pressure (!) 148/94, pulse 69, height 5\' 5"  (1.651 m), weight 146 lb (66.2 kg).  Physical Exam  General: The patient is alert and cooperative at the time of the examination.  Eyes: Pupils are equal, round, and reactive to light. Discs are flat bilaterally.  Neck: The neck is supple, no carotid bruits are noted.  Respiratory: The respiratory examination is clear.  Cardiovascular: The cardiovascular examination reveals a regular rate and rhythm, no obvious murmurs or rubs are noted.   Neuromuscular: Range of movement of the cervical spine is full.  Skin: Extremities  are without significant edema.  Neurologic Exam  Mental status: The patient is alert and oriented x 3 at the time of the examination. The patient has apparent normal recent and remote memory, with an apparently normal attention span and concentration ability.  Cranial nerves: Facial symmetry is present. There is good sensation of the face to pinprick and soft touch on the right, decreased on the left. The strength of the facial muscles and the muscles to head turning and shoulder shrug are normal bilaterally. Speech is well enunciated, no aphasia or dysarthria is noted. Extraocular movements are full. Visual fields are full. The tongue is midline, and the patient has symmetric elevation of the soft palate. No obvious hearing deficits are noted.  Motor: The motor testing reveals 5 over 5 strength of all 4 extremities. Good symmetric motor tone is noted throughout.  Sensory: Sensory testing is intact to pinprick, soft touch, vibration sensation, and position sense on all 4 extremities, with exception of some decreased pinprick sensation on the left arm and left leg. No evidence of extinction is noted.  Coordination: Cerebellar testing reveals good finger-nose-finger and heel-to-shin bilaterally.  Gait and station: Gait is normal. Tandem gait is normal. Romberg is negative. No drift is seen.  Reflexes: Deep tendon reflexes are symmetric and normal bilaterally. Toes are downgoing bilaterally.   Assessment/Plan:  1. Left hemisensory deficit, etiology unclear  The patient has had onset of sensory alteration on the left body, she has had chest discomfort, and discomfort in the interscapular area. She has a sensation of clumsiness of the left hand. MRI of the brain has shown no evidence of cerebrovascular disease or demyelinating disease. Carotid Doppler study was normal. The patient will be sent for MRI evaluation of the cervical spine, and she will have blood work done today. If the above study is  unremarkable, the patient is much more likely to have a benign etiology of her current sensory complaints. Her clinical objective examination is normal. The patient does not wish to go on any medications for discomfort.  Marlan Palau. Keith Tyonna Talerico MD 09/03/2016 11:53 AM  [facility] [address] [place], [place] [id number]  Phone [phone] Fax [phone]

## 2016-09-03 NOTE — Patient Instructions (Signed)
   We will get blood work today and get MRI of the cervical spine. 

## 2016-09-06 LAB — ANA W/REFLEX: ANA: NEGATIVE

## 2016-09-06 LAB — ANGIOTENSIN CONVERTING ENZYME: ANGIO CONVERT ENZYME: 81 U/L (ref 14–82)

## 2016-09-06 LAB — SEDIMENTATION RATE: Sed Rate: 2 mm/hr (ref 0–40)

## 2016-09-06 LAB — B. BURGDORFI ANTIBODIES: Lyme IgG/IgM Ab: <0.91 {ISR} (ref 0.00–0.90)

## 2016-09-07 DIAGNOSIS — R202 Paresthesia of skin: Secondary | ICD-10-CM | POA: Diagnosis not present

## 2016-09-08 ENCOUNTER — Ambulatory Visit (INDEPENDENT_AMBULATORY_CARE_PROVIDER_SITE_OTHER): Payer: Self-pay

## 2016-09-08 DIAGNOSIS — Z0289 Encounter for other administrative examinations: Secondary | ICD-10-CM

## 2016-09-08 DIAGNOSIS — R202 Paresthesia of skin: Secondary | ICD-10-CM

## 2016-09-09 ENCOUNTER — Telehealth: Payer: Self-pay | Admitting: Neurology

## 2016-09-09 NOTE — Telephone Encounter (Signed)
    I called the patient. The MRI of the cervical spine is unremarkable, nothing that would cause sensory symptoms in the arms and chest. Etiology of her symptoms is likely benign.   MRI cervical 09/09/16:  IMPRESSION:  Mildly abnormal MRI cervical spine (without) demonstrating: 1. Minimal disc bulging at C4-5 with no spinal stenosis or foraminal narrowing. 2. Remaining visualized levels from C2-3 to T3-4 are unremarkable. 3. Right thyroid heterogenous, multi-nodular goiter.  4. Compared to MRI from 02/28/09, the disc bulging at C4-5 is a new but minor finding. Otherwise no major changes.

## 2016-09-10 ENCOUNTER — Telehealth: Payer: Self-pay | Admitting: Endocrinology

## 2016-09-10 NOTE — Telephone Encounter (Signed)
I reviewed record and labs.  The symptoms would be coming from the thyroid.

## 2016-09-10 NOTE — Telephone Encounter (Signed)
Could we please review the records and assist the pt in understanding of whether the enlarged thyroid can be causing the issues she is having, is there something she needs to do, is there follow up?  MRI was just done can we look at it and get back with her.

## 2016-09-10 NOTE — Telephone Encounter (Signed)
i'm sorry.  I meant to say: The symptoms would not be coming from the thyroid.

## 2016-09-10 NOTE — Telephone Encounter (Signed)
See message and please advise, Thanks!  

## 2016-09-10 NOTE — Telephone Encounter (Signed)
Patient wanted to since the symptoms would be coming from the thyroid, is there something she should do or a follow up that is needed?

## 2016-09-10 NOTE — Telephone Encounter (Signed)
I contacted patient and advised of message. Patient verbalized understanding and had no further questions.

## 2016-09-15 ENCOUNTER — Telehealth: Payer: Self-pay | Admitting: Family Medicine

## 2016-09-15 NOTE — Telephone Encounter (Signed)
Pt called stating that she was referred out to a neurologist and had a ct done. Pt states that the CT does not coen side with what the neurologist is saying. According to the neurologist nothing is wrong and that she is good to go but the ct report states different. Pt is wanting Dr. Lorin PicketScott to review the CT report and tell her if he agrees with what the neurologist is saying. Please advise. Pt is aware he is out of the office today.

## 2016-09-16 ENCOUNTER — Telehealth: Payer: Self-pay | Admitting: Neurology

## 2016-09-16 NOTE — Telephone Encounter (Signed)
CAT scan showed bulging areas but did not show any surgical areas what symptoms are the patient having currently question?

## 2016-09-16 NOTE — Telephone Encounter (Signed)
Patient states that she is still having left sided numbness (radiates from arm to her eye) and dizziness.

## 2016-09-16 NOTE — Telephone Encounter (Signed)
Please let the patient know that I did read what Dr. Anne HahnWillis had to say and his consultation note I did review the MRI as previously discussed on the last phone message. I do not see anything surgical. Since she is having ongoing symptoms there does not appear to be any bad underlying cause evident at this point. The next step in this process is one of the following. #1-follow-up with Dr. Anne HahnWillis for further discussion regarding if she may need nerve conduction studies or EMG? #2 consider referral to different neurologist for a second opinion #3 watch and monitor the symptoms and follow-up with us within 4-6 weeks for recheck and if still having symptoms at that time then consider further opinion from additional neurology input. Unfortunately there is not any other tests or medications that I can do that will cure the problem. See what the patient would like to do then lets go from there

## 2016-09-16 NOTE — Telephone Encounter (Signed)
I called patient. MRI the cervical spine is unremarkable. No evidence of spinal cord compression or spinal cord lesions. Etiology of the paresthesias likely to be benign.   MRI brain 09/16/16:  IMPRESSION:  Mildly abnormal MRI cervical spine (without) demonstrating: 1. Minimal disc bulging at C4-5 with no spinal stenosis or foraminal narrowing. 2. Remaining visualized levels from C2-3 to T3-4 are unremarkable. 3. Right thyroid heterogenous, multi-nodular goiter.  4. Compared to MRI from 02/28/09, the disc bulging at C4-5 is a new but minor finding. Otherwise no major changes.

## 2016-09-16 NOTE — Telephone Encounter (Signed)
Left message on voicemail to return call.

## 2016-09-17 NOTE — Telephone Encounter (Signed)
Discussed with pt. Pt will follow up with dr Anne Hahnwillis

## 2016-09-17 NOTE — Telephone Encounter (Signed)
Patient is calling stating she did get MRI results. She is still having a tingling sensation and numbness in her left arm. She is having swelling under her left eye 3 days a week. She is still having random back and chest pain to her shoulder blades and when she gets up in the morning she is still dizzy and leans to the right. Her left leg is not numb anymore.

## 2016-09-20 NOTE — Telephone Encounter (Signed)
Pt is calling sts since Saturday morning the left arm up to the armpit is uncomfortable, feels tight like a tight blood pressure cuff, the entire arm. she also wants clarification on MRI @ T-3/hemangioma

## 2016-09-20 NOTE — Telephone Encounter (Signed)
I called the patient. MRI of the cervical spine was unremarkable.  The T3 hemangioma is generally a benign finding, found within the vertebral body.  If the patient is having discomfort at this point, a medication such as gabapentin can be added. The patient will call us if she wishes to go on medication.

## 2016-10-29 ENCOUNTER — Ambulatory Visit: Payer: 59 | Admitting: Family Medicine

## 2016-11-03 ENCOUNTER — Telehealth: Payer: Self-pay | Admitting: Neurology

## 2016-11-03 DIAGNOSIS — R202 Paresthesia of skin: Principal | ICD-10-CM

## 2016-11-03 DIAGNOSIS — R200 Anesthesia of skin: Secondary | ICD-10-CM

## 2016-11-03 NOTE — Addendum Note (Signed)
Addended by: Stephanie AcreWILLIS, Tuck Dulworth on: 11/03/2016 06:48 PM   Modules accepted: Orders

## 2016-11-03 NOTE — Telephone Encounter (Signed)
Pt called said she is still having numbness of the left arm and left foot with swelling left eye, dizziness. Says when she take 2 aleve/day the symptoms are almost alleviated. She said she wants to know if there are other options as opposed to starting lyrica. Please call

## 2016-11-03 NOTE — Telephone Encounter (Signed)
I called patient. The patient has ongoing symptoms on the left side that is completely eliminated by the use of Aleve. She does not want to go on Lyrica.  She wants to know what is causing her problem, so far we have, with no etiology following MRI the brain, cervical spine, and blood work.  We will check nerve conduction study of the left arm and left leg, EMG of the left arm and left leg.

## 2016-11-08 ENCOUNTER — Telehealth: Payer: Self-pay | Admitting: Family Medicine

## 2016-11-08 ENCOUNTER — Telehealth: Payer: Self-pay | Admitting: Neurology

## 2016-11-08 NOTE — Telephone Encounter (Signed)
I reviewed over her lab work from late last fall.(The patient has my chart and should be able to review as well) the blood work did not show underactive thyroid on the labs I saw. It is reasonable to recheck thyroid function with TSH, free T4, T3 uptake. I also believe it is reasonable for the patient to check a B12 level. I did see where MRI of her neck did show thyroid multinodular but also saw that Dr. Everardo AllEllison back in November did not feel her symptoms were coming from her thyroid I would recommend patient repeat lab work and keep her on follow-up office visit so we can discuss how things are going with her in where to go from here

## 2016-11-08 NOTE — Telephone Encounter (Signed)
Spoke with patient and informed her per Dr.Scott Luking- Dr.Scott reviewed over her lab work from late last fall.(The patient has my chart and should be able to review as well) the blood work did not show underactive thyroid on the labs Dr.Scott  saw. It is reasonable to recheck thyroid function with TSH, free T4, T3 uptake. Dr.Scott  also believe it is reasonable for the patient to check a B12 level. Dr.Scott  did see where MRI of her neck did show thyroid multinodular but also saw that Dr. Everardo AllEllison back in November did not feel her symptoms were coming from her thyroid Dr.Scott  would recommend patient repeat lab work and keep her on follow-up office visit so we can discuss how things are going with her in where to go from here. Patient verbalized understanding and stated that she isn't due for her labs until May for her thyroid since she just had them done in November. States she will hold off on B12 level.

## 2016-11-08 NOTE — Telephone Encounter (Signed)
Called pt lvm for her to call to schedule EMG ordered 11/03/16. cb

## 2016-11-08 NOTE — Telephone Encounter (Signed)
Noted, thank you

## 2016-11-08 NOTE — Telephone Encounter (Signed)
Left message return call 11/08/16 

## 2016-11-08 NOTE — Telephone Encounter (Signed)
Patient is seeing Dr. Lorin PicketScott on 11/19/16 for a recheck on her left sided numbness.  She said she has been feeling pretty much like crap since October, she has been to 4 different specialists with no results except they found she has an underactive thyroid.  She is wondering if Dr. Lorin PicketScott can call in something for her underactive thyroid to see if it would help her.  [facility]

## 2016-11-09 ENCOUNTER — Telehealth: Payer: Self-pay | Admitting: Family Medicine

## 2016-11-09 DIAGNOSIS — R5383 Other fatigue: Secondary | ICD-10-CM

## 2016-11-09 NOTE — Telephone Encounter (Signed)
Patient called back. [facility] but is still confused.  She isn't due for labs until May but if Dr. Lorin PicketScott thinks she should go ahead and get them she will.  Needs to know how to do this.  Do we send an order somewhere and if so where?  Just needs to know if she should have the labs and if so how and where?  She can't answer her cell at work but if we can leave a message for her with instructions she will check her messages.  Cell# [phone]

## 2016-11-09 NOTE — Telephone Encounter (Signed)
Nurse's-please look back on the original message from the past couple days. The [facility] stating that she was having ongoing troubles and also stating that she had been diagnosed with a underactive thyroid. I looked at the records from Dr. Everardo AllEllison the endocrinologist. His lab work did not show underactive thyroid. Therefore I suggested repeating the thyroid tests along with the B12. I do believe that would be reasonable. If the patient would like to go ahead and get that drawn she can if she would rather wait until she comes in and sits down and discuss is her case with us that would be fine as well.

## 2016-11-10 NOTE — Telephone Encounter (Signed)
Patient called office in reference to scheduling EMG, per patient he is going to wait until she get more labs drawn with per PCP before scheduling.

## 2016-11-10 NOTE — Telephone Encounter (Signed)
Spoke with patient and informed her per Dr.Scott Luking- Dr. George HughEllison's lab work did not show an underactive thyroid. Therefore Dr.Scott suggests repeating the thyroid tests along with B12. Dr.Scott does believe that it would be reasonable. If you would like to go ahead and get that drawn you can or you can wait until your appointment and discuss the case with Dr.Scott. Patient verbalized understanding and stated that she would like to get the labs done.

## 2016-11-10 NOTE — Telephone Encounter (Signed)
Left message return call 11/10/16 

## 2016-11-10 NOTE — Telephone Encounter (Signed)
Dr. Willis- FYI 

## 2016-11-15 ENCOUNTER — Other Ambulatory Visit: Payer: Self-pay | Admitting: Family Medicine

## 2016-11-15 DIAGNOSIS — R5383 Other fatigue: Secondary | ICD-10-CM | POA: Diagnosis not present

## 2016-11-16 LAB — T4, FREE: FREE T4: 0.98 ng/dL (ref 0.82–1.77)

## 2016-11-16 LAB — T3 UPTAKE: T3 UPTAKE RATIO: 27 % (ref 24–39)

## 2016-11-16 LAB — VITAMIN B12: VITAMIN B 12: 375 pg/mL (ref 232–1245)

## 2016-11-16 LAB — TSH: TSH: 2.73 u[IU]/mL (ref 0.450–4.500)

## 2016-11-19 ENCOUNTER — Ambulatory Visit (INDEPENDENT_AMBULATORY_CARE_PROVIDER_SITE_OTHER): Payer: 59 | Admitting: Family Medicine

## 2016-11-19 ENCOUNTER — Encounter: Payer: Self-pay | Admitting: Family Medicine

## 2016-11-19 VITALS — BP 130/80 | Temp 98.2°F | Ht 65.0 in | Wt 144.5 lb

## 2016-11-19 DIAGNOSIS — G629 Polyneuropathy, unspecified: Secondary | ICD-10-CM | POA: Diagnosis not present

## 2016-11-19 LAB — SPECIMEN STATUS REPORT

## 2016-11-19 NOTE — Progress Notes (Signed)
   Subjective:    Patient ID: Karina Heath, female    DOB: 08-13-1961, 56 y.o.   MRN: [id number]  HPI Patient is here today for a follow up visit on left sided numbness. Patient states that her symptoms have not improved. The numbness is now moving to the right side.  Patient has no other concerns at this time.   Patient relates ongoing numbness tingling on the left side of her body she also states at times she feels like coordination in her hand is not as good plus strength in her arms and legs seem to be good but occasionally seen weaker in the left arm she denies any headaches double vision nausea vomiting sweats chills denies being depressed  She states that she would be content with her symptoms but she feels like there is something going on that needs further delineation. She has [place] neurolog[place] in [place] who did multiple testing without specific conclusion. Patient did not want to go on Lyrica.  Patient gets occasional sharp chest pains occasionally when she is active occasionally at rest only last a few seconds no shortness of breath or sweats associated with it  Patient's energy level overall doing well. Recent B12 tests low normal await methylmalonic acid test Review of Systems  Constitutional: Negative for chills, fatigue and fever.  HENT: Negative for congestion.   Respiratory: Negative for shortness of breath and wheezing.   Cardiovascular: Positive for chest pain. Negative for leg swelling.       She gets an occasional sharp chest pain intermittently  Gastrointestinal: Negative for abdominal pain.  Genitourinary: Negative for frequency.  Musculoskeletal: Negative for arthralgias, back pain and gait problem.  Neurological: Positive for dizziness and numbness.       Occasional dizziness occasional lightheadedness also relates tingling sensation on the left side of her body plus also some in her foot as well as a tingling on the right side of her face       Objective:   Physical Exam  Constitutional: She appears well-developed. No distress.  HENT:  Head: Normocephalic and atraumatic.  Eyes: Right eye exhibits no discharge. Left eye exhibits no discharge.  Cardiovascular: Normal rate, regular rhythm and normal heart sounds.   No murmur heard. Pulmonary/Chest: Effort normal and breath sounds normal. No respiratory distress.  Musculoskeletal: She exhibits no edema or tenderness.  Strength is normal bilateral  Lymphadenopathy:    She has no cervical adenopathy.  Neurological: No cranial nerve deficit. Coordination normal.  She has very brisk reflexes on the right patella she also has subjective tingling on the left side of her body  Skin: Skin is warm. No rash noted. No erythema.          Assessment & Plan:  This patient appears to have a peripheral neuropathy issue. She is had MRI of the cervical spine and brain without a defined etiology. She is seen a local neurologist.  Patient having progressive symptoms she is concerned about it she would like further looking into this. We will [facility].  Borderline low B12. Await the findings of methylmalonic acid.  No sign of thyroid dysfunction currently  Sharp chest pains unlikely to be cardiac in origin no further testing unless worsening symptoms patient agrees with this

## 2016-11-20 ENCOUNTER — Encounter: Payer: Self-pay | Admitting: Family Medicine

## 2016-11-21 ENCOUNTER — Encounter: Payer: Self-pay | Admitting: Family Medicine

## 2016-11-22 ENCOUNTER — Encounter: Payer: Self-pay | Admitting: Family Medicine

## 2016-11-23 LAB — METHYLMALONIC ACID, SERUM: METHYLMALONIC ACID: 210 nmol/L (ref 0–378)

## 2016-11-23 LAB — SPECIMEN STATUS REPORT

## 2016-12-02 ENCOUNTER — Encounter: Payer: Self-pay | Admitting: Family Medicine

## 2017-02-01 DIAGNOSIS — Z029 Encounter for administrative examinations, unspecified: Secondary | ICD-10-CM

## 2017-02-04 ENCOUNTER — Telehealth: Payer: Self-pay | Admitting: Family Medicine

## 2017-02-04 NOTE — Telephone Encounter (Signed)
Patient dropped off FMLA to cover her husband while she out sick.Husband hadnt missed any days out of work yet but wanted paper work in place.Please fill out pages 3 and 4 wasn't sure what to put since husband hasnt missed any days yet. He will be out on 5/23 to [facility] and future appointment with specialist. Form is your yellow folder. Please date /sign form

## 2017-02-06 NOTE — Telephone Encounter (Signed)
This form was completed thank you 

## 2017-02-23 DIAGNOSIS — R29818 Other symptoms and signs involving the nervous system: Secondary | ICD-10-CM | POA: Diagnosis not present

## 2017-11-14 IMAGING — DX DG CHEST 2V
2 series · 2 of 2 positions shown · non-contrast
Comparison: None.

CLINICAL DATA: Chest tightness with radiation into the neck,
initial encounter

EXAM:
CHEST  2 VIEW

[chest pa]
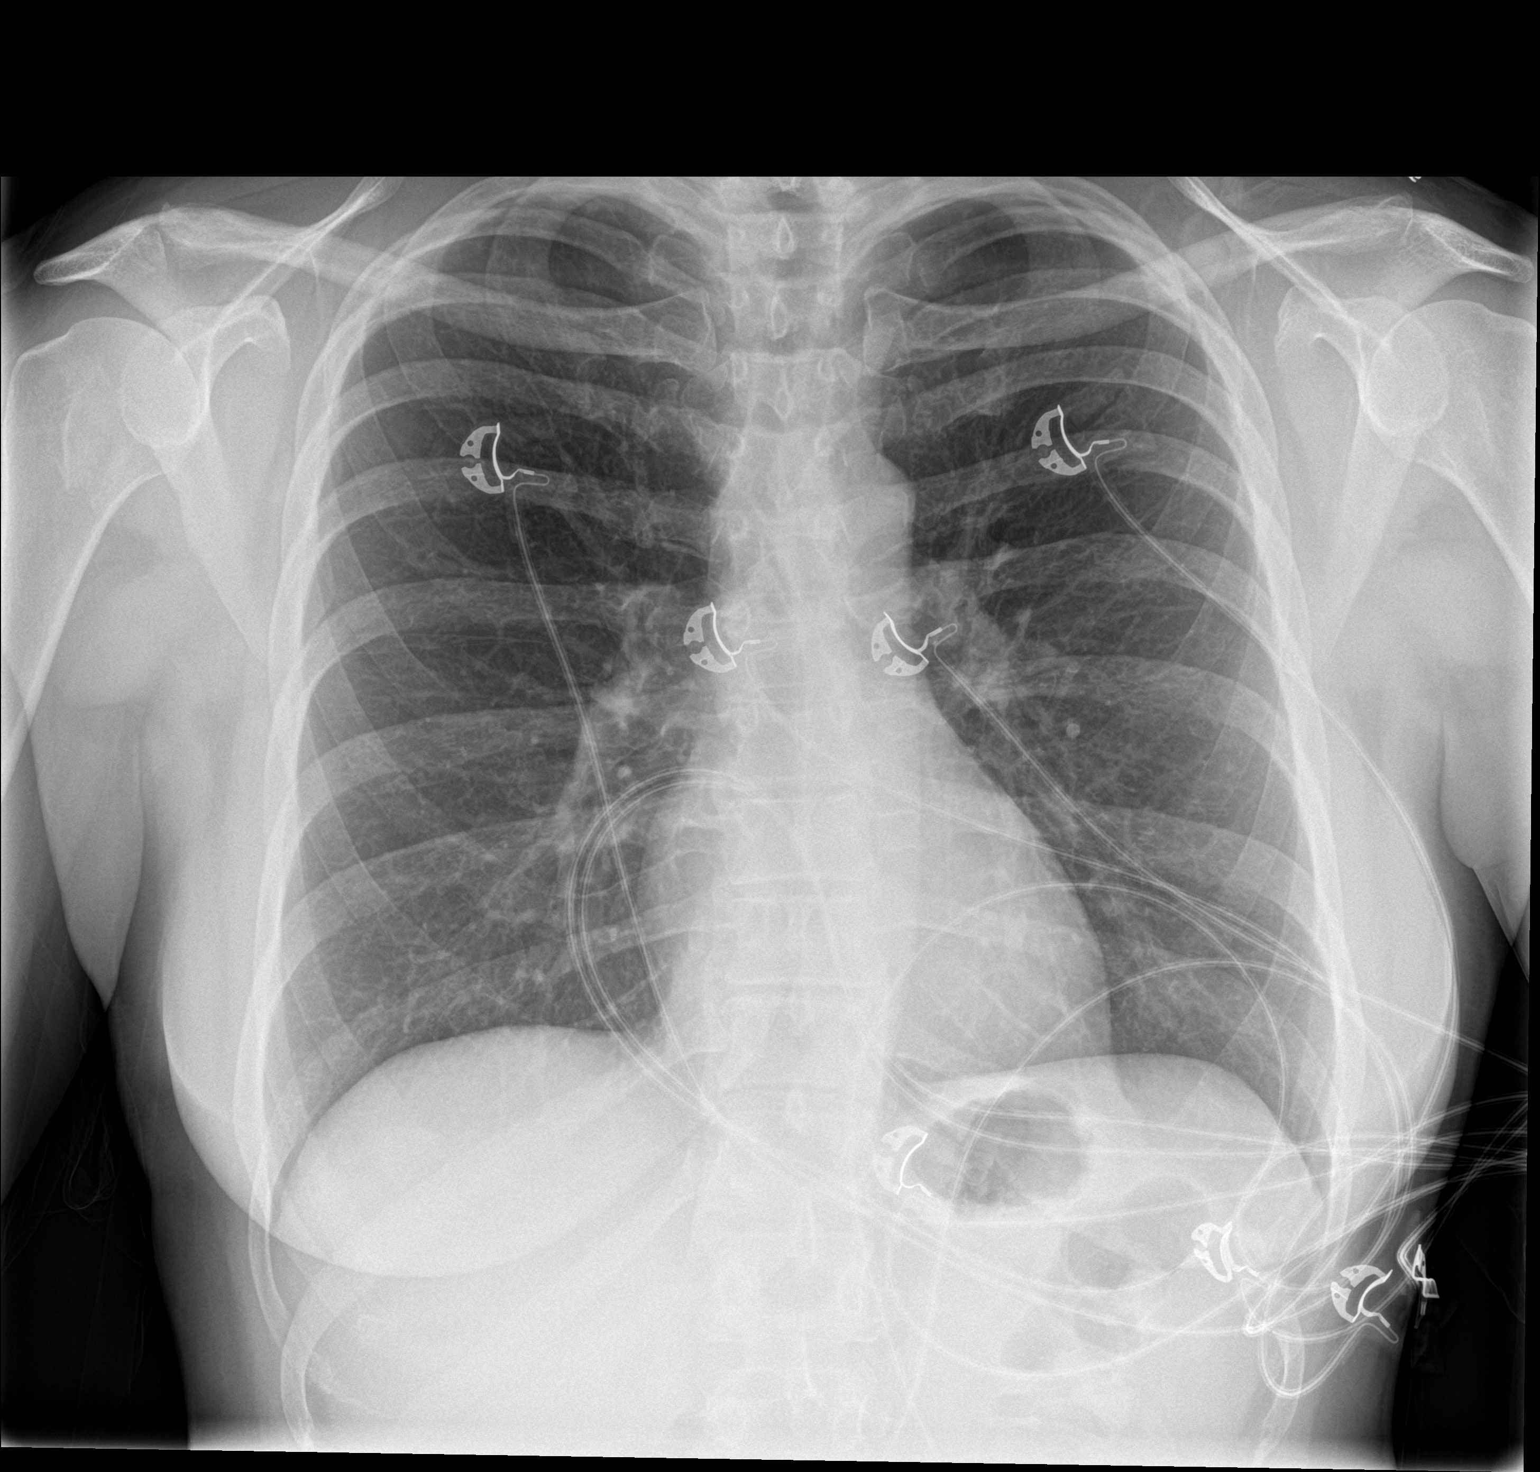

[chest lat]
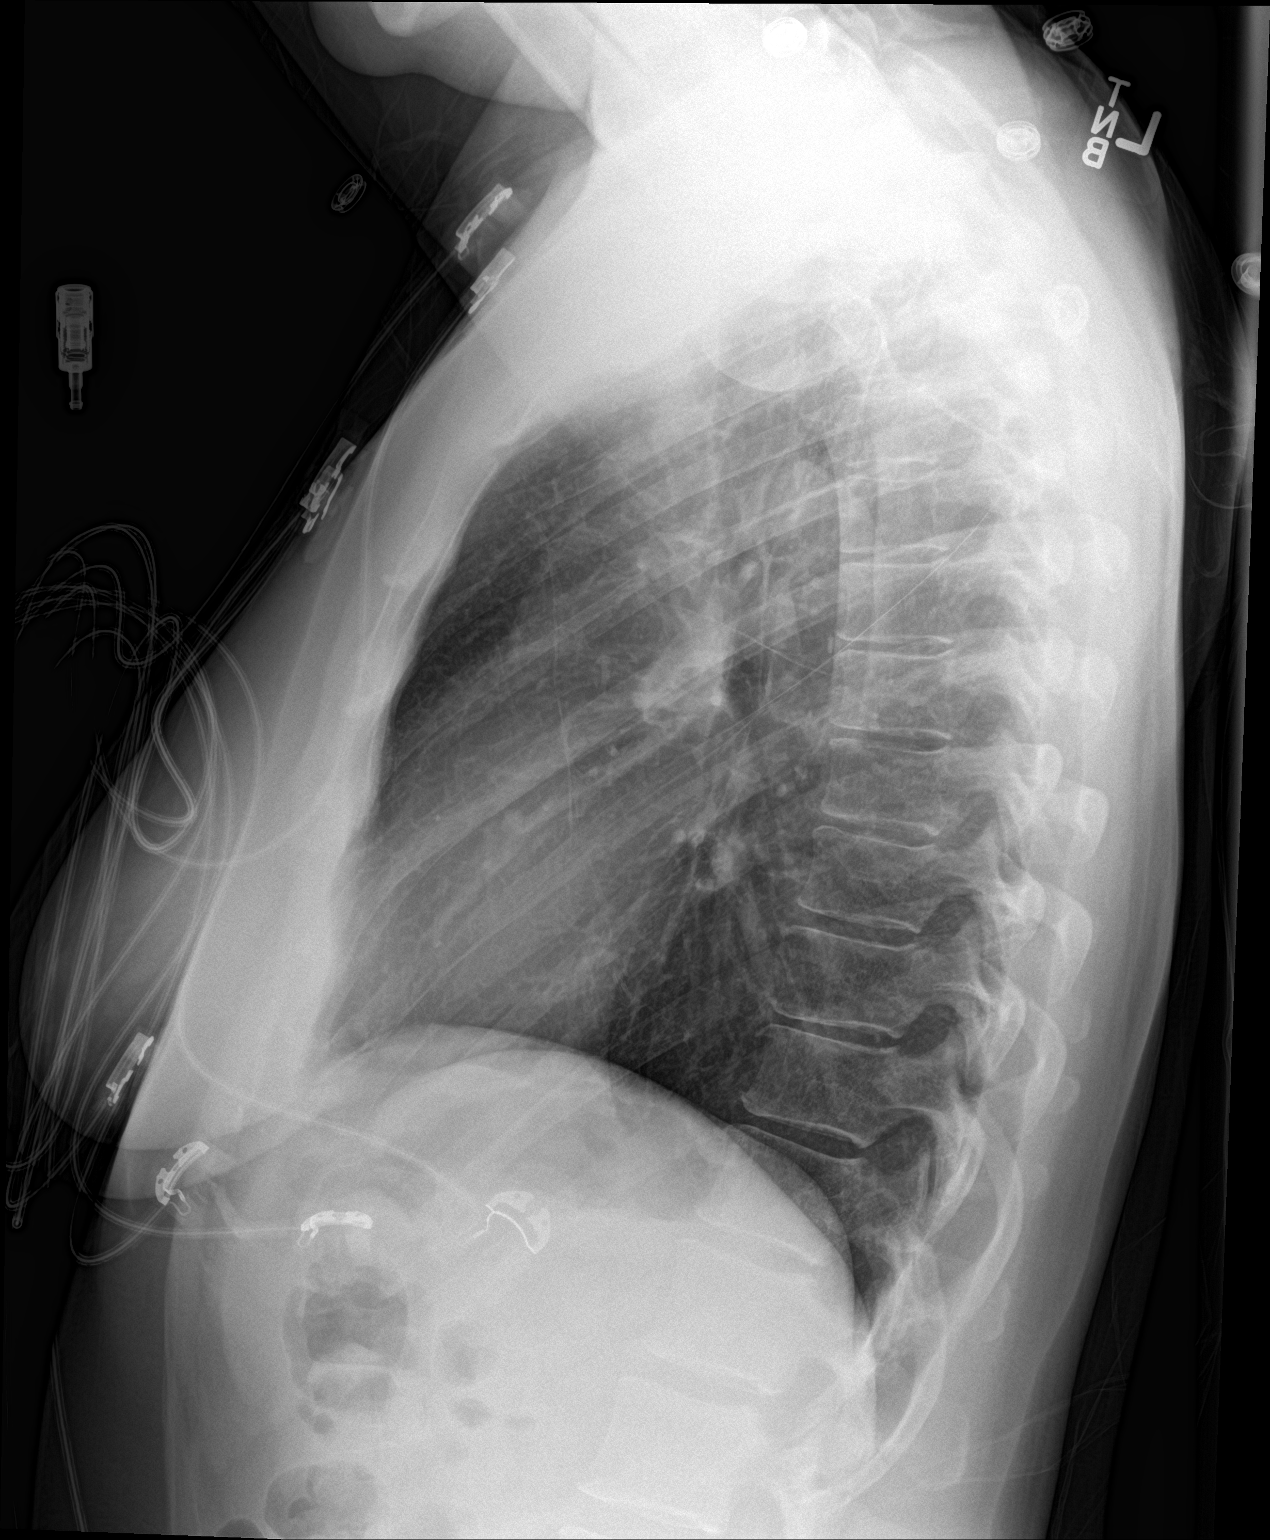

[2 of 2 positions shown; findings below may reference images not displayed]

FINDINGS: The heart size and mediastinal contours are within normal limits.
Both lungs are clear. The visualized skeletal structures are
unremarkable.
IMPRESSION: No active cardiopulmonary disease.

## 2017-11-25 ENCOUNTER — Other Ambulatory Visit: Payer: Self-pay | Admitting: *Deleted

## 2017-11-25 ENCOUNTER — Telehealth: Payer: Self-pay | Admitting: Family Medicine

## 2017-11-25 MED ORDER — SULFACETAMIDE SODIUM 10 % OP SOLN: OPHTHALMIC | 0 refills | Status: DC

## 2017-11-25 NOTE — Telephone Encounter (Signed)
Pt is needing something called in for possible pink eye.     [facility] [place]

## 2017-11-25 NOTE — Telephone Encounter (Signed)
Left voicemail to call back to see if she what symptoms she is having.

## 2017-11-25 NOTE — Telephone Encounter (Signed)
Pt is having pink eye symptoms. Sulfacetamide eye drops sent in per dr Brett Canalessteve. Pt notified to follow up in office if not getting better. Med sent to pharm.

## 2017-12-06 ENCOUNTER — Telehealth: Payer: Self-pay | Admitting: Family Medicine

## 2017-12-06 MED ORDER — GABAPENTIN 100 MG PO CAPS: 100.0000 mg | ORAL_CAPSULE | Freq: Three times a day (TID) | ORAL | 1 refills | Status: DC

## 2017-12-06 NOTE — Telephone Encounter (Signed)
That would be fine.  We can use gabapentin 100 mg 1 3 times daily, #90, 2 refills this gives her time to schedule a follow-up office visit this spring and if all is going well we can renew the medication for multiple months

## 2017-12-06 NOTE — Telephone Encounter (Signed)
Patient notified and stated she will call back to schedule office visit

## 2017-12-06 NOTE — Telephone Encounter (Signed)
Patient said that Dr. Lorin PicketScott referred [facility].  She said they gave her Gabapentin 100 mg 3 times daily.  She wants to know if Dr. Lorin PicketScott can refill this for her so she doesn't have to go all the way to [facility] anymore?   [facility] 

## 2017-12-06 NOTE — Telephone Encounter (Signed)
Prescription sent electronically to pharmacy. (left message for patient tom return call-needs office visit)

## 2018-02-28 ENCOUNTER — Encounter: Payer: Self-pay | Admitting: Family Medicine

## 2018-02-28 ENCOUNTER — Ambulatory Visit: Payer: 59 | Admitting: Family Medicine

## 2018-02-28 VITALS — BP 124/80 | Ht 65.0 in | Wt 148.0 lb

## 2018-02-28 DIAGNOSIS — G629 Polyneuropathy, unspecified: Secondary | ICD-10-CM

## 2018-02-28 MED ORDER — GABAPENTIN 100 MG PO CAPS: 100.0000 mg | ORAL_CAPSULE | Freq: Three times a day (TID) | ORAL | 3 refills | Status: DC

## 2018-02-28 NOTE — Progress Notes (Signed)
   Subjective:    Patient ID: Karina Heath, female    DOB: 05-May-1961, 57 y.o.   MRN: [id number]  HPI  Patient is here today to follow up on her chronic health issues.Needs refills on Gabapentin for her functional neurologic disorder. Patient was seen by [facility] neurology over a year ago at that time they diagnosed functional hemiplegic neuropathy they did not recommend any additional testing they recommend using gabapentin patient is satisfied with the results of the medication uses it 2 or 3 times per day is requesting today to have refills she denies excessive stress denies any health issues she does keep up on her female health checkups and preventative care through her gynecologist Review of Systems Denies fevers chills weight loss vomiting diarrhea    Objective:   Physical Exam  Lungs are clear no crackles heart is regular no murmurs pulses normal extremities no edema skin warm dry      Assessment & Plan:  Peripheral neuropathy Under good control medication Taking gabapentin 2 or 3 times per day If any significant setbacks follow-up sooner otherwise follow-up in 1 year

## 2018-04-11 ENCOUNTER — Telehealth: Payer: Self-pay | Admitting: Family Medicine

## 2018-04-11 ENCOUNTER — Other Ambulatory Visit: Payer: Self-pay | Admitting: Family Medicine

## 2018-04-11 MED ORDER — CIPROFLOXACIN HCL 250 MG PO TABS: ORAL_TABLET | ORAL | 0 refills | Status: DC

## 2018-04-11 NOTE — Telephone Encounter (Signed)
Patient said she started having a UTI yesterday.  She started Azo but feels she needs an antibiotic.  She said that we have called in antibiotic for her before and wants to know if this is possible.  Please advise.  Karina Heath

## 2018-04-11 NOTE — Telephone Encounter (Signed)
[facility] [place]. Pt stated that she only needed a call back if the med could be sent in. Pt states she will pick up the med on her way from work.

## 2018-04-11 NOTE — Telephone Encounter (Signed)
Pt states Cipro was given before. Frequent urination and burning. Azo has helped, burning not as bad. [facility] [place].

## 2018-04-11 NOTE — Telephone Encounter (Signed)
Left message to return call to get more info on symptoms

## 2018-04-11 NOTE — Telephone Encounter (Signed)
cipro 250 bid 5 d 

## 2018-08-25 ENCOUNTER — Other Ambulatory Visit: Payer: Self-pay | Admitting: Family Medicine

## 2018-09-05 ENCOUNTER — Ambulatory Visit: Payer: 59 | Admitting: Family Medicine

## 2018-09-05 ENCOUNTER — Telehealth: Payer: Self-pay | Admitting: Family Medicine

## 2018-09-05 NOTE — Telephone Encounter (Signed)
Patient is wanting some names of a functional urologist. She states that she been to several specialist but no luck in getting help for what's going on with her so she cancelled her appointment for today she just wanted another referral who specializes in functional testing.

## 2018-09-05 NOTE — Telephone Encounter (Signed)
Nurses please talk with the patient.  Let her know we want to try to help her.  I would like to have a few more details of what type of symptoms she is having and what type of problem she is having so we can see if we can help point her in the right direction

## 2018-09-06 NOTE — Telephone Encounter (Signed)
I called and left a message to r/c. 

## 2018-09-06 NOTE — Telephone Encounter (Signed)
Same symptoms she has had for years she said.  numbness right arm now it was her left for a long time now her right arm.  Has not felt her fingertips in the past 6 weeks. Has been to 5 different specialist including one at [facility].  She has done some research and would like to try Functional neurologist in [place]. May leave detailed message on voicemail.

## 2018-09-06 NOTE — Telephone Encounter (Signed)
Patient states she doesn't not need an actual referral thru her insurance and wonders if you know of any functional neurologists in the area he would recommend. Patient states a regular neurologist is no help for her(patient states she has seen 5 with no help). Patient was wondering if you knew any in the area or you felt she she should go with the ones in [place]. She is fine with his recommendations and is just looking for relief.

## 2018-09-06 NOTE — Telephone Encounter (Signed)
I am not opposed for her to see functional neurology Please let the patient know we will be happy to help with a referral  Please find out from the patient is she speaking of [facility] [place]? This is a chiropractic neurology center.  This is not a actual neurologist in the medical doctor type instead it is a chiropractic center for neurology issues As long as the patient would like to be referred there we can give a referral Phone [phone]

## 2018-09-08 ENCOUNTER — Encounter: Payer: Self-pay | Admitting: Family Medicine

## 2018-09-09 NOTE — Telephone Encounter (Signed)
I sent the patient a my chart message.

## 2018-09-13 ENCOUNTER — Encounter: Payer: Self-pay | Admitting: Family Medicine

## 2018-09-19 DIAGNOSIS — M542 Cervicalgia: Secondary | ICD-10-CM | POA: Diagnosis not present

## 2018-09-19 DIAGNOSIS — M25511 Pain in right shoulder: Secondary | ICD-10-CM | POA: Diagnosis not present

## 2018-10-31 ENCOUNTER — Telehealth: Payer: Self-pay

## 2018-10-31 NOTE — Telephone Encounter (Signed)
I contacted the pt and advised Dr. Anne Hahn had a earlier appt come open today at 3 pm. Pt declined pt and requested appt scheduled in March of 2020 be canceled as well. Pt advised me she is currently seeing a Neurology group within [facility] and will call back if she needs to f/u with our clinic. Appt canceled and pt removed from wait list.

## 2018-12-04 ENCOUNTER — Ambulatory Visit: Payer: 59 | Admitting: Neurology

## 2019-02-16 ENCOUNTER — Other Ambulatory Visit: Payer: Self-pay | Admitting: Family Medicine

## 2019-04-04 ENCOUNTER — Other Ambulatory Visit: Payer: Self-pay | Admitting: Family Medicine

## 2019-04-04 NOTE — Telephone Encounter (Signed)
May have 1 refill currently  Needs to do virtual visit follow-up  At the time of that visit  if all is stable with the patient we can do a 1 year refill

## 2019-04-04 NOTE — Telephone Encounter (Signed)
Pt is calling to see if Dr. Nicki Reaper would give her one more refill on medication before having to come in. She also asked how often he would want her to follow up on the medication.   She has 3 pills left of the medication.   I told her we would check with Dr. Nicki Reaper to see if she would need to follow up every 3 months on medication or 6 months once Dr. Nicki Reaper responds to [place] and we can do this visit virtually.

## 2019-04-05 NOTE — Telephone Encounter (Signed)
Please schedule virtual visit and then send back to nurses to schedule.

## 2019-04-05 NOTE — Telephone Encounter (Signed)
Left message for patient to schedule appointment.

## 2019-04-10 NOTE — Telephone Encounter (Signed)
Patient scheduled an appt for 05/09/19.  Has been out of meds for a few days and would like to get the rx as soon as possible.

## 2019-05-09 ENCOUNTER — Other Ambulatory Visit: Payer: Self-pay

## 2019-05-09 ENCOUNTER — Ambulatory Visit (INDEPENDENT_AMBULATORY_CARE_PROVIDER_SITE_OTHER): Payer: BC Managed Care – PPO | Admitting: Family Medicine

## 2019-05-09 DIAGNOSIS — G629 Polyneuropathy, unspecified: Secondary | ICD-10-CM

## 2019-05-09 DIAGNOSIS — Z1211 Encounter for screening for malignant neoplasm of colon: Secondary | ICD-10-CM | POA: Diagnosis not present

## 2019-05-09 MED ORDER — GABAPENTIN 100 MG PO CAPS: 100.0000 mg | ORAL_CAPSULE | Freq: Three times a day (TID) | ORAL | 12 refills | Status: DC

## 2019-05-09 NOTE — Progress Notes (Signed)
   Subjective:    Patient ID: Karina Heath, female    DOB: [birth year], 58 y.o.   MRN: [id number]  HPImed check up on gabapentin. Pt states she takes 100mg  one bid but wants to leave the directions on her med list that has one tid. States med is working well and she has no concerns today.  Extremely nice patient Phone visit unable to do video Overall patient states she is doing fairly well but she does state some intermittent numbness on the right side as well as occasional weakness on that side she is followed with a neurologist at [facility].  She decided against doing EMG Gabapentin taken to a day does help her.  We also discussed the importance of regular female health checkup mammogram colonoscopy lab work etc. she states she is getting this through her gynecologist but she is interested in stool testing for blood Virtual Visit via Video Note  I connected with Karina Heath on 05/09/19 at  3:00 PM EDT by a video enabled telemedicine application and verified that I am speaking with the correct person using two identifiers.  Location: Patient: home Provider: office   I discussed the limitations of evaluation and management by telemedicine and the availability of in person appointments. The patient expressed understanding and agreed to proceed.  History of Present Illness:    Observations/Objective:   Assessment and Plan:   Follow Up Instructions:    I discussed the assessment and treatment plan with the patient. The patient was provided an opportunity to ask questions and all were answered. The patient agreed with the plan and demonstrated an understanding of the instructions.   The patient was advised to call back or seek an in-person evaluation if the symptoms worsen or if the condition fails to improve as anticipated.  I provided 15 minutes of non-face-to-face time during this encounter. 15 minutes was spent with patient today discussing healthcare issues which they  came.  More than 50% of this visit-total duration of visit-was spent in counseling and coordination of care.  Please see diagnosis regarding the focus of this coordination and care       Review of Systems  Constitutional: Negative for activity change, appetite change and fatigue.  HENT: Negative for congestion.   Respiratory: Negative for cough.   Cardiovascular: Negative for chest pain.  Gastrointestinal: Negative for abdominal pain.  Skin: Negative for color change.  Neurological: Positive for numbness. Negative for headaches.  Psychiatric/Behavioral: Negative for behavioral problems.       Objective:   Physical Exam  Patient had virtual visit Appears to be in no distress Atraumatic Neuro able to relate and oriented No apparent resp distress Color normal       Assessment & Plan:  Right side numbness so far is been unexplained but felt to be part of a syndrome via [facility].  They recommend the gabapentin the patient doing well with that she like to stick with that she does not want to make any changes she takes 2 a day 1 year supply was given she is to follow-up if ongoing troubles or problems  Also patient was advised regarding colonoscopy she defers on this but is interested in doing stool test we will order up [place]

## 2019-05-10 NOTE — Addendum Note (Signed)
Addended by: Vicente Males on: 05/10/2019 08:59 AM   Modules accepted: Orders

## 2019-05-10 NOTE — Progress Notes (Signed)
IBFOT has been mailed to patient and order placed in Epic

## 2019-07-27 ENCOUNTER — Other Ambulatory Visit: Payer: Self-pay | Admitting: *Deleted

## 2019-07-27 DIAGNOSIS — Z20822 Contact with and (suspected) exposure to covid-19: Secondary | ICD-10-CM

## 2019-07-29 LAB — NOVEL CORONAVIRUS, NAA: SARS-CoV-2, NAA: NOT DETECTED

## 2020-03-10 ENCOUNTER — Telehealth: Payer: Self-pay | Admitting: *Deleted

## 2020-03-10 NOTE — Telephone Encounter (Signed)
...   natural medicine doctors or holistic medicine. She states she is thinking dr Mikey Bussing is one that will do those tests. Informed her I will speak to dr Mikey Bussing and call her back 6/8

## 2020-03-10 NOTE — Telephone Encounter (Signed)
Pt calls and states she has read about dr Mikey Bussing and was wondering if he would consider taking her on as a pt. She states she has FUNCTIONAL NEUROLOGIC DISORDER and she has been to 2 neurologist, pcp and specialists at [facility] and they are not willing to look deeper into her condition. She would like hair samples analyzed, blood work that may tell about what toxins are causing her problems and treatments that may not be mainstream. She states dr Mikey Bussing is listed as one doctor with her insurance that may be willing to look deeper. Triage nurse informed her that she may need to look at

## 2020-03-11 ENCOUNTER — Telehealth: Payer: Self-pay | Admitting: Family Medicine

## 2020-03-11 NOTE — Telephone Encounter (Signed)
Spoke w/ pt and informed her she is in need of a different level of care than what [facility] can provide suggested she try to connect w/ naturopathic md, she was agreeable

## 2020-03-11 NOTE — Telephone Encounter (Signed)
One year supply sent in August 2020. Contacted pharmacy and they do have refills but script will expire in 05/08/20. Contacted patient and informed her that she may call her pharmacy and request refill. Pt verbalized understanding.

## 2020-03-11 NOTE — Telephone Encounter (Signed)
I suspect that she has been referred to me by her insurance because I am a DO, however I do not send for hair samples for toxins and I dont believe I practice the type of functional medicine she is looking for.  She certainly could be offered an appointment with that understanding and also that I am part of a residency clinic and will have limited availability she she will occasionally have to see other doctors in the clinic.  I suspect as you noted she may be looking for a naturopathic doctor.

## 2020-03-11 NOTE — Telephone Encounter (Signed)
Patient is requesting refill on gabapentin 100 mg last fill 05/09/19 and was last seen 05/09/19 for sick visit.She only wants a 30 day supply called into [facility]

## 2020-06-23 ENCOUNTER — Other Ambulatory Visit: Payer: Self-pay | Admitting: Family Medicine

## 2020-06-23 NOTE — Telephone Encounter (Signed)
Med Follow Up  made for 10/25   Pt call back [phone]

## 2020-07-28 ENCOUNTER — Ambulatory Visit: Payer: 59 | Admitting: Family Medicine

## 2020-07-28 ENCOUNTER — Other Ambulatory Visit: Payer: Self-pay

## 2020-07-28 ENCOUNTER — Encounter: Payer: Self-pay | Admitting: Family Medicine

## 2020-07-28 VITALS — BP 108/76 | HR 93 | Temp 97.3°F | Ht 65.0 in | Wt 147.2 lb

## 2020-07-28 DIAGNOSIS — G629 Polyneuropathy, unspecified: Secondary | ICD-10-CM

## 2020-07-28 DIAGNOSIS — Z13220 Encounter for screening for lipoid disorders: Secondary | ICD-10-CM | POA: Diagnosis not present

## 2020-07-28 DIAGNOSIS — Z1211 Encounter for screening for malignant neoplasm of colon: Secondary | ICD-10-CM

## 2020-07-28 DIAGNOSIS — Z79899 Other long term (current) drug therapy: Secondary | ICD-10-CM

## 2020-07-28 DIAGNOSIS — Z23 Encounter for immunization: Secondary | ICD-10-CM

## 2020-07-28 MED ORDER — GABAPENTIN 100 MG PO CAPS: 100.0000 mg | ORAL_CAPSULE | Freq: Three times a day (TID) | ORAL | 3 refills | Status: DC

## 2020-07-28 NOTE — Progress Notes (Signed)
° °  Subjective:    Patient ID: Karina Heath, female    DOB: 1961/01/12, 59 y.o.   MRN: [id number]  HPI  Peripheral neuropathy Patient has seen neurology in the past Does well on gabapentin It is doing a good job controlling it Not having any balance problems No pain or discomfort currently She would like to continue the medicine Requesting refill on gabapentin.   Patient would like to discuss options for colon cancer screening.  We did discuss stool testing for blood, Cologuard as well as colonoscopy patient prefers Cologuard currently Review of Systems  Constitutional: Negative for activity change, appetite change and fatigue.  HENT: Negative for congestion.   Respiratory: Negative for cough.   Cardiovascular: Negative for chest pain.  Gastrointestinal: Negative for abdominal pain.  Skin: Negative for color change.  Neurological: Negative for headaches.  Psychiatric/Behavioral: Negative for behavioral problems.       Objective:   Physical Exam Vitals reviewed.  Constitutional:      General: She is not in acute distress. HENT:     Head: Normocephalic.  Cardiovascular:     Rate and Rhythm: Normal rate and regular rhythm.     Heart sounds: Normal heart sounds. No murmur heard.   Pulmonary:     Effort: Pulmonary effort is normal.     Breath sounds: Normal breath sounds.  Lymphadenopathy:     Cervical: No cervical adenopathy.  Neurological:     Mental Status: She is alert.  Psychiatric:        Behavior: Behavior normal.           Assessment & Plan:  1. Peripheral polyneuropathy Check lab work.  Continue gabapentin. - CBC with Differential/Platelet - Comprehensive Metabolic Panel (CMET)  2. Screening for colon cancer Cologuard recommended patient agrees to this does not want to do colonoscopy currently, we will fill out the order for Cologuard send it to the company and they will connect with patient  3. Screening for lipid disorders Lipid profile - Lipid  panel  4. High risk medication use Lab work ordered - CBC with Differential/Platelet - Comprehensive Metabolic Panel (CMET)  Cologuard info was filled out and faxed to the company.

## 2020-07-29 ENCOUNTER — Telehealth: Payer: Self-pay | Admitting: *Deleted

## 2020-07-29 NOTE — Telephone Encounter (Signed)
Called patient to let her know Cologuard form requires her signature before we can fax it. Either she can stop by and sign or we can fax it and she can fax it back.

## 2020-08-05 NOTE — Telephone Encounter (Signed)
Left message to return call 

## 2020-08-05 NOTE — Telephone Encounter (Signed)
Pt states she will come in today and sign form. Form up front.

## 2020-08-18 LAB — COLOGUARD: Cologuard: NEGATIVE

## 2020-08-23 LAB — CBC WITH DIFFERENTIAL/PLATELET
Basophils Absolute: 0.1 10*3/uL (ref 0.0–0.2)
Basos: 1 %
EOS (ABSOLUTE): 0.1 10*3/uL (ref 0.0–0.4)
Eos: 2 %
Hematocrit: 43.6 % (ref 34.0–46.6)
Hemoglobin: 14.4 g/dL (ref 11.1–15.9)
Immature Grans (Abs): 0 10*3/uL (ref 0.0–0.1)
Immature Granulocytes: 0 %
Lymphocytes Absolute: 1.5 10*3/uL (ref 0.7–3.1)
Lymphs: 27 %
MCH: 29.1 pg (ref 26.6–33.0)
MCHC: 33 g/dL (ref 31.5–35.7)
MCV: 88 fL (ref 79–97)
Monocytes Absolute: 0.5 10*3/uL (ref 0.1–0.9)
Monocytes: 8 %
Neutrophils Absolute: 3.5 10*3/uL (ref 1.4–7.0)
Neutrophils: 62 %
Platelets: 307 10*3/uL (ref 150–450)
RBC: 4.94 x10E6/uL (ref 3.77–5.28)
RDW: 12.7 % (ref 11.7–15.4)
WBC: 5.7 10*3/uL (ref 3.4–10.8)

## 2020-08-23 LAB — COMPREHENSIVE METABOLIC PANEL
ALT: 14 IU/L (ref 0–32)
AST: 18 IU/L (ref 0–40)
Albumin/Globulin Ratio: 1.9 (ref 1.2–2.2)
Albumin: 4.6 g/dL (ref 3.8–4.9)
Alkaline Phosphatase: 77 IU/L (ref 44–121)
BUN/Creatinine Ratio: 15 (ref 9–23)
BUN: 14 mg/dL (ref 6–24)
Bilirubin Total: 0.4 mg/dL (ref 0.0–1.2)
CO2: 26 mmol/L (ref 20–29)
Calcium: 9.3 mg/dL (ref 8.7–10.2)
Chloride: 101 mmol/L (ref 96–106)
Creatinine, Ser: 0.95 mg/dL (ref 0.57–1.00)
GFR calc Af Amer: 76 mL/min/{1.73_m2} (ref >59)
GFR calc non Af Amer: 66 mL/min/{1.73_m2} (ref >59)
Globulin, Total: 2.4 g/dL (ref 1.5–4.5)
Glucose: 84 mg/dL (ref 65–99)
Potassium: 5.5 mmol/L — ABNORMAL HIGH (ref 3.5–5.2)
Sodium: 140 mmol/L (ref 134–144)
Total Protein: 7 g/dL (ref 6.0–8.5)

## 2020-08-23 LAB — LIPID PANEL
Chol/HDL Ratio: 4.5 ratio — ABNORMAL HIGH (ref 0.0–4.4)
Cholesterol, Total: 272 mg/dL — ABNORMAL HIGH (ref 100–199)
HDL: 61 mg/dL (ref >39)
LDL Chol Calc (NIH): 182 mg/dL — ABNORMAL HIGH (ref 0–99)
Triglycerides: 161 mg/dL — ABNORMAL HIGH (ref 0–149)
VLDL Cholesterol Cal: 29 mg/dL (ref 5–40)

## 2020-08-26 ENCOUNTER — Encounter: Payer: Self-pay | Admitting: Family Medicine

## 2020-09-01 ENCOUNTER — Encounter: Payer: Self-pay | Admitting: Family Medicine

## 2020-09-01 NOTE — Telephone Encounter (Signed)
Called pt and she states she is use to her gyn checking her thyroid due to past thyroid issues but states she does not want to go back to lab right now and she did not talk with dr Lorin Picket about ordering it. States she is coming back in 6 months and will discuss with dr scott at that time. Not having any issues to where she wants to go and do it now.

## 2020-09-02 LAB — COLOGUARD: COLOGUARD: NEGATIVE

## 2020-09-24 ENCOUNTER — Other Ambulatory Visit: Payer: Self-pay | Admitting: Family Medicine

## 2020-10-06 ENCOUNTER — Encounter: Payer: Self-pay | Admitting: Family Medicine

## 2021-01-15 LAB — HM PAP SMEAR: HM Pap smear: NEGATIVE

## 2021-06-30 ENCOUNTER — Encounter: Payer: Self-pay | Admitting: Family Medicine

## 2021-06-30 DIAGNOSIS — R5383 Other fatigue: Secondary | ICD-10-CM

## 2021-06-30 DIAGNOSIS — E559 Vitamin D deficiency, unspecified: Secondary | ICD-10-CM

## 2021-06-30 DIAGNOSIS — E785 Hyperlipidemia, unspecified: Secondary | ICD-10-CM

## 2021-06-30 DIAGNOSIS — E538 Deficiency of other specified B group vitamins: Secondary | ICD-10-CM

## 2021-06-30 DIAGNOSIS — E875 Hyperkalemia: Secondary | ICD-10-CM

## 2021-06-30 DIAGNOSIS — Z79899 Other long term (current) drug therapy: Secondary | ICD-10-CM

## 2021-06-30 MED ORDER — GABAPENTIN 100 MG PO CAPS: 100.0000 mg | ORAL_CAPSULE | Freq: Three times a day (TID) | ORAL | 2 refills | Status: DC

## 2021-06-30 NOTE — Addendum Note (Signed)
Addended by: Marlowe Shores on: 06/30/2021 02:24 PM   Modules accepted: Orders

## 2021-06-30 NOTE — Telephone Encounter (Signed)
Nurses Please go ahead and order lipid profile, metabolic 7, B12, TSH, free T4, vitamin D  Diagnosis vitamin D deficiency, fatigue, B12 deficiency, hyperkalemia, hyperlipidemia  Go ahead and give her 3 months of refills  Please let Kalleigh know we need to do a follow-up visit before the end of the year.  Thanks-Dr. Lorin Picket

## 2021-07-11 LAB — BASIC METABOLIC PANEL
BUN/Creatinine Ratio: 16 (ref 12–28)
BUN: 14 mg/dL (ref 8–27)
CO2: 26 mmol/L (ref 20–29)
Calcium: 9.4 mg/dL (ref 8.7–10.3)
Chloride: 101 mmol/L (ref 96–106)
Creatinine, Ser: 0.86 mg/dL (ref 0.57–1.00)
Glucose: 89 mg/dL (ref 70–99)
Potassium: 4.8 mmol/L (ref 3.5–5.2)
Sodium: 140 mmol/L (ref 134–144)
eGFR: 77 mL/min/{1.73_m2} (ref >59)

## 2021-07-11 LAB — LIPID PANEL
Chol/HDL Ratio: 3.8 ratio (ref 0.0–4.4)
Cholesterol, Total: 237 mg/dL — ABNORMAL HIGH (ref 100–199)
HDL: 62 mg/dL (ref >39)
LDL Chol Calc (NIH): 151 mg/dL — ABNORMAL HIGH (ref 0–99)
Triglycerides: 133 mg/dL (ref 0–149)
VLDL Cholesterol Cal: 24 mg/dL (ref 5–40)

## 2021-07-11 LAB — VITAMIN B12: Vitamin B-12: 592 pg/mL (ref 232–1245)

## 2021-07-11 LAB — TSH: TSH: 2.61 u[IU]/mL (ref 0.450–4.500)

## 2021-07-11 LAB — T4, FREE: Free T4: 1.01 ng/dL (ref 0.82–1.77)

## 2021-07-11 LAB — VITAMIN D 25 HYDROXY (VIT D DEFICIENCY, FRACTURES): Vit D, 25-Hydroxy: 74.6 ng/mL (ref 30.0–100.0)

## 2021-07-14 ENCOUNTER — Encounter: Payer: Self-pay | Admitting: Family Medicine

## 2021-08-12 ENCOUNTER — Encounter: Payer: Self-pay | Admitting: Family Medicine

## 2021-08-24 ENCOUNTER — Encounter: Payer: Self-pay | Admitting: Family Medicine

## 2021-09-10 ENCOUNTER — Ambulatory Visit: Payer: 59 | Admitting: Family Medicine

## 2021-10-01 ENCOUNTER — Encounter: Payer: Self-pay | Admitting: Family Medicine

## 2021-11-11 ENCOUNTER — Other Ambulatory Visit: Payer: Self-pay

## 2021-11-11 ENCOUNTER — Ambulatory Visit: Payer: 59 | Admitting: Family Medicine

## 2021-11-11 ENCOUNTER — Encounter: Payer: Self-pay | Admitting: Family Medicine

## 2021-11-11 VITALS — BP 120/72 | Ht 65.0 in | Wt 144.8 lb

## 2021-11-11 DIAGNOSIS — G609 Hereditary and idiopathic neuropathy, unspecified: Secondary | ICD-10-CM

## 2021-11-11 DIAGNOSIS — R7989 Other specified abnormal findings of blood chemistry: Secondary | ICD-10-CM

## 2021-11-11 HISTORY — DX: Other specified abnormal findings of blood chemistry: R79.89

## 2021-11-11 MED ORDER — GABAPENTIN 100 MG PO CAPS: 100.0000 mg | ORAL_CAPSULE | Freq: Three times a day (TID) | ORAL | 12 refills | Status: DC

## 2021-11-11 NOTE — Patient Instructions (Addendum)
We will look into possibility hemochromatosis We will let you know what we find out  Follow up basd on what we find out  Shingrix and shingles prevention: know the facts!   Shingrix is a very effective vaccine to prevent shingles.   Shingles is a reactivation of chickenpox -more than 99% of Americans born before 1980 have had chickenpox even if they do not remember it. One in every 10 people who get shingles have severe long-lasting nerve pain as a result.   33 out of a 100 older adults will get shingles if they are unvaccinated.     This vaccine is very important for your health This vaccine is indicated for anyone 50 years or older. You can get this vaccine even if you have already had shingles because you can get the disease more than once in a lifetime.  Your risk for shingles and its complications increases with age.  This vaccine has 2 doses.  The second dose would be 2 to 6 months after the first dose.  If you had Zostavax vaccine in the past you should still get Shingrix. ( Zostavax is only 70% effective and it loses significant strength over a few years .)  This vaccine is given through the pharmacy.  The cost of the vaccine is through your insurance. The pharmacy can inform you of the total costs.  Common side effects including soreness in the arm, some redness and swelling, also some feel fatigue muscle soreness headache low-grade fever.  Side effects typically go away within 2 to 3 days. Remember-the pain from shingles can last a lifetime but these side effects of the vaccine will only last a few days at most. It is very important to get both doses in order to protect yourself fully.   Please get this vaccine at your earliest convenience at your trusted pharmacy.

## 2021-11-11 NOTE — Progress Notes (Signed)
° °  Subjective:    Patient ID: Karina Heath, female    DOB: 03/26/1961, 60 y.o.   MRN: [id number]  HPI  Patient arrives for a follow up on peripheral polyneuropathy.  Patient states she has had blood work to test for mold and awaiting results. Patient saw specialist they did some additional testing looking at homocystine as well as other labs.  These were reviewed today.  She does have a genetic tendency toward elevated homocystine They recommended doing a homocystine level but I told her that I believe her integrative medicine group in [place] will do this and she can forward [place] the results She will forward [place] the results of the others as well Also her ferritin was elevated TIBC was up we will touch base with specialist to see if further intervention is warranted  She brought lab work from the integrative medicine group that she saw Most notable is hemoglobin 15.3, hematocrit 47.5 Iron saturation 65%, ferritin 243 T41.12 TSH 1.72 Vitamin D 73 Thyroid paradox ACE TPO antibody 321 Vitamin B12 706 Also DNA analysis showing heterozygous finding that could put her at risk of increased plasma homocystine Copy of all this will be scanned into the chart Review of Systems     Objective:   Physical Exam General-in no acute distress Eyes-no discharge Lungs-respiratory rate normal, CTA CV-no murmurs,RRR Extremities skin warm dry no edema Neuro grossly normal Behavior normal, alert        Assessment & Plan:   As for the ferritin we will touch base with specialist to see if other intervention is necessary may need further work-up potentially even genetic testing for hemochromatosis  Neuropathy idiopathic patient seen integrative medicine currently for further input and evaluation May end up needing other testing Continue gabapentin currently Call us if any problems or issues Follow-up at least yearly Patient to give [place] feedback after she sees integrative medicine for  follow-up on homocystine

## 2021-11-26 ENCOUNTER — Telehealth: Payer: Self-pay

## 2021-11-26 DIAGNOSIS — R7989 Other specified abnormal findings of blood chemistry: Secondary | ICD-10-CM

## 2021-11-26 NOTE — Progress Notes (Signed)
Called and informed patient per drs notes and recommendations. Labs ordered .

## 2021-11-26 NOTE — Telephone Encounter (Signed)
-----   Message from Kathyrn Drown, MD sent at 11/25/2021  8:31 PM EST ----- Hi David Rodriquez-please let the patient know that I did communicate with hematology regarding her elevated ferritin.  Hematology states at this point in time they do not feel that this is a major issue but they do recommend a follow-up lab work in 3 months.  So therefore I recommend ferritin, TIBC, hemoglobin hematocrit in 3 months.  If it remains elevated we may have to set her up with hematology for a consult-thanks-Dr. Nicki Reaper ----- Message ----- From: Kathyrn Drown, MD Sent: 11/21/2021   9:24 AM EST To: Kathyrn Drown, MD  Staff message sent to [facility] hematology regarding ferritin TIBC

## 2021-11-29 ENCOUNTER — Encounter: Payer: Self-pay | Admitting: Family Medicine

## 2021-12-04 ENCOUNTER — Encounter: Payer: Self-pay | Admitting: Family Medicine

## 2021-12-17 ENCOUNTER — Encounter: Payer: Self-pay | Admitting: Family Medicine

## 2021-12-17 DIAGNOSIS — R7989 Other specified abnormal findings of blood chemistry: Secondary | ICD-10-CM

## 2021-12-20 NOTE — Telephone Encounter (Signed)
Nurses ?Please see other MyChart messages to the patient ?Please help set up [facility] for a referral to hematology. ?She is to indicate to [place] whether she prefers hematology here at [facility] or [facility] ?Referral reason elevated ferritin, heterozygous hemochromatosis mutation ?

## 2021-12-21 NOTE — Telephone Encounter (Signed)
Nurses-please go ahead with referral to Dr. Delton Coombes for evaluation of elevated ferritin and hemochromatosis genetic testing results ?

## 2021-12-28 ENCOUNTER — Encounter: Payer: Self-pay | Admitting: Family Medicine

## 2021-12-31 NOTE — Telephone Encounter (Signed)
Nurses ?I read over Autie's note ?Certainly I can see how what they are using could be helpful for her ?Unfortunately both of these usage of these medicines is outside the area of my expertise. ?I did connect with gynecology to see if they also would do progesterone but they stated that they are concerned that that can increase risk of heart disease with continued usage. ? ?So therefore if the medication is benefiting her and she would like to continue it then I would recommend that she follow through with her clinic that is currently prescribing ?

## 2022-01-14 ENCOUNTER — Encounter (HOSPITAL_COMMUNITY): Payer: Self-pay | Admitting: Hematology

## 2022-01-19 ENCOUNTER — Encounter: Payer: Self-pay | Admitting: Family Medicine

## 2022-01-19 DIAGNOSIS — E063 Autoimmune thyroiditis: Secondary | ICD-10-CM

## 2022-01-19 NOTE — Telephone Encounter (Signed)
Nurses-please see other message thank you ?

## 2022-01-19 NOTE — Telephone Encounter (Signed)
Please ?Please let Sana know I did some further research.  Use of naltrexone is outside the area of my expertise especially for Hashimoto thyroiditis.  There are several good endocrinologist available to her. ?Dr. Fransico Him is available here in [place] ?Dr.Kerr is available with [facility] endocrinology ?And also Dr.Balan with [facility] ? ?Please let us know who she would like to see and we can help set this up ?Thanks-Dr. Lorin Picket ?

## 2022-01-22 ENCOUNTER — Inpatient Hospital Stay (HOSPITAL_COMMUNITY): Payer: 59

## 2022-01-22 ENCOUNTER — Inpatient Hospital Stay (HOSPITAL_COMMUNITY): Payer: 59 | Attending: Hematology | Admitting: Hematology

## 2022-01-22 VITALS — BP 151/84 | HR 77 | Temp 97.8°F | Resp 16 | Ht 65.0 in | Wt 138.8 lb

## 2022-01-22 DIAGNOSIS — Z79899 Other long term (current) drug therapy: Secondary | ICD-10-CM | POA: Diagnosis not present

## 2022-01-22 DIAGNOSIS — R7989 Other specified abnormal findings of blood chemistry: Secondary | ICD-10-CM | POA: Insufficient documentation

## 2022-01-22 DIAGNOSIS — E063 Autoimmune thyroiditis: Secondary | ICD-10-CM | POA: Diagnosis not present

## 2022-01-22 LAB — CBC WITH DIFFERENTIAL/PLATELET
Abs Immature Granulocytes: 0.02 10*3/uL (ref 0.00–0.07)
Basophils Absolute: 0.1 10*3/uL (ref 0.0–0.1)
Basophils Relative: 1 %
Eosinophils Absolute: 0.1 10*3/uL (ref 0.0–0.5)
Eosinophils Relative: 2 %
HCT: 43.2 % (ref 36.0–46.0)
Hemoglobin: 14.2 g/dL (ref 12.0–15.0)
Immature Granulocytes: 0 %
Lymphocytes Relative: 24 %
Lymphs Abs: 1.5 10*3/uL (ref 0.7–4.0)
MCH: 29.5 pg (ref 26.0–34.0)
MCHC: 32.9 g/dL (ref 30.0–36.0)
MCV: 89.8 fL (ref 80.0–100.0)
Monocytes Absolute: 0.5 10*3/uL (ref 0.1–1.0)
Monocytes Relative: 8 %
Neutro Abs: 4.1 10*3/uL (ref 1.7–7.7)
Neutrophils Relative %: 65 %
Platelets: 280 10*3/uL (ref 150–400)
RBC: 4.81 MIL/uL (ref 3.87–5.11)
RDW: 13.4 % (ref 11.5–15.5)
WBC: 6.2 10*3/uL (ref 4.0–10.5)
nRBC: 0 % (ref 0.0–0.2)

## 2022-01-22 LAB — HEPATIC FUNCTION PANEL
ALT: 17 U/L (ref 0–44)
AST: 22 U/L (ref 15–41)
Albumin: 4.2 g/dL (ref 3.5–5.0)
Alkaline Phosphatase: 65 U/L (ref 38–126)
Bilirubin, Direct: 0.1 mg/dL (ref 0.0–0.2)
Indirect Bilirubin: 0.4 mg/dL (ref 0.3–0.9)
Total Bilirubin: 0.5 mg/dL (ref 0.3–1.2)
Total Protein: 7.4 g/dL (ref 6.5–8.1)

## 2022-01-22 LAB — IRON AND TIBC
Iron: 111 ug/dL (ref 28–170)
Saturation Ratios: 46 % — ABNORMAL HIGH (ref 10.4–31.8)
TIBC: 244 ug/dL — ABNORMAL LOW (ref 250–450)
UIBC: 133 ug/dL

## 2022-01-22 LAB — FERRITIN: Ferritin: 118 ng/mL (ref 11–307)

## 2022-01-22 NOTE — Patient Instructions (Addendum)
[facility] at [facility] ?Discharge Instructions ? ?You were seen and examined today by Dr. Ellin Saba. Dr. Ellin Saba is a hematologist, meaning that he specializes in blood abnormalities. Dr. Ellin Saba discussed your past medical history, family history of cancers/blood conditions and the events that led to you being here today. ? ?You were referred to Dr. Ellin Saba due to elevated ferritin (iron storage). Dr. Ellin Saba has recommended additional lab work today, to recheck your blood counts and iron levels. Dr. Ellin Saba will also check your liver function, because liver impacts iron levels. ? ?According to your most recent lab work, you are a carrier for hemochromatosis but you do not have it. It is possible that this is the reasoning for your increased ferritin. Iron levels can also be impacted by inflammation within the body, so it is possible that this is coming from the mold presence in your house. ? ?Please follow-up as scheduled. ? ? ?Thank you for choosing [facility] at [facility] to provide your oncology and hematology care.  To afford each patient quality time with our provider, please arrive at least 15 minutes before your scheduled appointment time.  ? ?If you have a lab appointment with the Cancer Center please come in thru the Main Entrance and check in at the main information desk. ? ?You need to re-schedule your appointment should you arrive 10 or more minutes late.  We strive to give you quality time with our providers, and arriving late affects you and other patients whose appointments are after yours.  Also, if you no show three or more times for appointments you may be dismissed from the clinic at the providers discretion.     ?Again, thank you for choosing [facility].  Our hope is that these requests will decrease the amount of time that you wait before being seen by our physicians.        ?_____________________________________________________________ ? ?Should you have questions after your visit to [facility], please contact our office at [phone] and follow the prompts.  Our office hours are 8:00 a.m. and 4:30 p.m. Monday - Friday.  Please note that voicemails left after 4:00 p.m. may not be returned until the following business day.  We are closed weekends and major holidays.  You do have access to a nurse 24-7, just call the main number to the clinic [phone] and do not press any options, hold on the line and a nurse will answer the phone.   ? ?For prescription refill requests, have your pharmacy contact our office and allow 72 hours.   ? ?Due to Covid, you will need to wear a mask upon entering the hospital. If you do not have a mask, a mask will be given to you at the Main Entrance upon arrival. For doctor visits, patients may have 1 support person age 60 or older with them. For treatment visits, patients can not have anyone with them due to social distancing guidelines and our immunocompromised population.  ? ? ? ?

## 2022-01-22 NOTE — Progress Notes (Signed)
? ?[place] ?[address] ?[employer] [place], [place] [address] ? ? ?CLINIC:  ?Medical Oncology/Hematology ? ?Patient Care Team: ?Kathyrn Drown, MD as PCP - General (Family Medicine) ?Derek Jack, MD as Medical Oncologist (Hematology) ? ?CHIEF COMPLAINTS/PURPOSE OF CONSULTATION:  ?Evaluation of elevated ferritin ? ?HISTORY OF PRESENTING ILLNESS:  ?Karina Heath 61 y.o. female is here because of evaluation of elevated ferritin, at the request of Dr. Wolfgang Phoenix. ? ?Today she reports feeling good. She reports she has functional neurological disorder diagnosed in 2017. She reports numbness in her left side for which she takes Gabapentin. She reports a history of Hashimoto's disease. She denies history of liver problems and blood transfusions. She has never taken iron supplements. She denies recurrent infections, fevers, and weight loss. She reports hot flashes. She reports occasional palpitations which have been improving. She denies skin discoloration. She denies joint pains.  ? 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 she denies smoking history. She denies family history of hemachromatosis. Her father had ITP. Her paternal grandfather had cancer although she cannot recall what type, a maternal uncle had prostate cancer, her maternal great grand mother had pancreatic and breast cancer.  ? ?MEDICAL HISTORY:  ?Past Medical History:  ?Diagnosis Date  ? Elevated ferritin 11/11/2021  ? Hemoglobin normal ferritin elevated  ? Erythema nodosum   ? GERD (gastroesophageal reflux disease)   ? Other specified disorders of thyroid   ? Inflamed thyroid  ? ? ?SURGICAL HISTORY: ?Past Surgical History:  ?Procedure Laterality Date  ? NO PAST SURGERIES    ? ? ?SOCIAL HISTORY: ?Social History  ? ?Socioeconomic History  ? Marital status: Married  ?  Spouse name: Not on file  ? Number of children: 2  ? Years of education: 32  ? Highest education level: Not on file  ?Occupational History  ? Occupation:  [employer]  ?Tobacco Use  ? Smoking status: Never  ? Smokeless tobacco: Never  ?Substance and Sexual Activity  ? Alcohol use: Yes  ?  Comment: occas glass of wine  ? Drug use: No  ? Sexual activity: Not on file  ?Other Topics Concern  ? Not on file  ?Social History Narrative  ? Lives at home w/ her husband  ? Right-handed  ? Caffeine: 2 cups coffee each morning, occasional glass of tea  ? ?Social Determinants of Health  ? ?Financial Resource Strain: Not on file  ?Food Insecurity: Not on file  ?Transportation Needs: Not on file  ?Physical Activity: Not on file  ?Stress: Not on file  ?Social Connections: Not on file  ?Intimate Partner Violence: Not on file  ? ? ?FAMILY HISTORY: ?Family History  ?Problem Relation Age of Onset  ? Heart attack Brother   ? Diabetes Brother   ? Heart failure Maternal Grandmother   ? Heart attack Maternal Grandfather   ? Thyroid disease Neg Hx   ? ? ?ALLERGIES:  is allergic to latex and silicone. ? ?MEDICATIONS:  ?Current Outpatient Medications  ?Medication Sig Dispense Refill  ? Bacillus Coagulans-Inulin (PROBIOTIC) 1-250 BILLION-MG CAPS     ? cholecalciferol (VITAMIN D) 1000 units tablet Take 2,000 Units by mouth at bedtime.    ? Folic Acid-Vit [id number] [id number] (FOLBEE) 2.5-25-1 MG TABS tablet Take 1 tablet by mouth as directed. Takes twice daily three times a week ?B6 2 mg/ b12(as Methylcobalamin) 5000 mcg/b12 (as hydroxycobalamin) 2013mcg/ Folate 5459mcg DFE    ? gabapentin (NEURONTIN) 100 MG capsule Take 1  capsule (100 mg total) by mouth 3 (three) times daily. 90 capsule 12  ? ibuprofen (ADVIL,MOTRIN) 200 MG tablet Take 400 mg by mouth every 6 (six) hours as needed for headache, mild pain or moderate pain.     ? itraconazole (SPORANOX) 100 MG capsule Take 100 mg by mouth 2 (two) times daily.    ? Melatonin 10 MG TABS Take by mouth.    ? Naltrexone HCl, Pain, 1.5 MG CAPS Take by mouth.    ? ?No current facility-administered medications for this visit.  ? ? ?REVIEW OF SYSTEMS:    ?Review of Systems  ?Constitutional:  Negative for appetite change, fatigue, fever and unexpected weight change.  ?Cardiovascular:  Positive for palpitations (improving).  ?Gastrointestinal:  Positive for constipation.  ?Endocrine: Positive for hot flashes.  ?Musculoskeletal:  Negative for arthralgias.  ?Neurological:  Positive for headaches and numbness (L side).  ?Psychiatric/Behavioral:  Positive for sleep disturbance.   ?All other systems reviewed and are negative. ? ? ?PHYSICAL EXAMINATION: ?ECOG PERFORMANCE STATUS: 0 - Asymptomatic ? ?Vitals:  ? 01/22/22 1008  ?BP: (!) 151/84  ?Pulse: 77  ?Resp: 16  ?Temp: 97.8 ?F (36.6 ?C)  ?SpO2: 95%  ? ?Filed Weights  ? 01/22/22 1008  ?Weight: 138 lb 12.8 oz (63 kg)  ? ?Physical Exam ?Vitals reviewed.  ?Constitutional:   ?   Appearance: Normal appearance.  ?Cardiovascular:  ?   Rate and Rhythm: Normal rate and regular rhythm.  ?   Pulses: Normal pulses.  ?   Heart sounds: Normal heart sounds.  ?Pulmonary:  ?   Effort: Pulmonary effort is normal.  ?   Breath sounds: Normal breath sounds.  ?Abdominal:  ?   Palpations: Abdomen is soft. There is no hepatomegaly, splenomegaly or mass.  ?   Tenderness: There is no abdominal tenderness.  ?Musculoskeletal:  ?   Right lower leg: No edema.  ?   Left lower leg: No edema.  ?Lymphadenopathy:  ?   Cervical: No cervical adenopathy.  ?   Right cervical: No superficial cervical adenopathy. ?   Left cervical: No superficial cervical adenopathy.  ?   Upper Body:  ?   Right upper body: No supraclavicular or axillary adenopathy.  ?   Left upper body: No supraclavicular or axillary adenopathy.  ?   Lower Body: No right inguinal adenopathy. No left inguinal adenopathy.  ?Neurological:  ?   General: No focal deficit present.  ?   Mental Status: She is alert and oriented to person, place, and time.  ?Psychiatric:     ?   Mood and Affect: Mood normal.     ?   Behavior: Behavior normal.  ? ? ? ?LABORATORY DATA:  ?I have reviewed the data as  listed ?Recent Results (from the past 2160 hour(s))  ?Ferritin     Status: None  ? Collection Time: 01/22/22 10:33 AM  ?Result Value Ref Range  ? Ferritin 118 11 - 307 ng/mL  ?  Comment: Performed at [facility], [address] [place], [place] [address]  ?Iron and TIBC ([facility] ONLY)     Status: Abnormal  ? Collection Time: 01/22/22 10:33 AM  ?Result Value Ref Range  ? Iron 111 28 - 170 ug/dL  ? TIBC 244 (L) 250 - 450 ug/dL  ? Saturation Ratios 46 (H) 10.4 - 31.8 %  ? UIBC 133 ug/dL  ?  Comment: Performed at [facility], [address] [place], [place] [address]  ?Hepatic function panel  Status: None  ? Collection Time: 01/22/22 10:33 AM  ?Result Value Ref Range  ? Total Protein 7.4 6.5 - 8.1 g/dL  ? Albumin 4.2 3.5 - 5.0 g/dL  ? AST 22 15 - 41 U/L  ? ALT 17 0 - 44 U/L  ? Alkaline Phosphatase 65 38 - 126 U/L  ? Total Bilirubin 0.5 0.3 - 1.2 mg/dL  ? Bilirubin, Direct 0.1 0.0 - 0.2 mg/dL  ? Indirect Bilirubin 0.4 0.3 - 0.9 mg/dL  ?  Comment: Performed at [facility], [address], [place], [place] [address]  ?CBC with Differential     Status: None  ? Collection Time: 01/22/22 10:33 AM  ?Result Value Ref Range  ? WBC 6.2 4.0 - 10.5 K/uL  ? RBC 4.81 3.87 - 5.11 MIL/uL  ? Hemoglobin 14.2 12.0 - 15.0 g/dL  ? HCT 43.2 36.0 - 46.0 %  ? MCV 89.8 80.0 - 100.0 fL  ? MCH 29.5 26.0 - 34.0 pg  ? MCHC 32.9 30.0 - 36.0 g/dL  ? RDW 13.4 11.5 - 15.5 %  ? Platelets 280 150 - 400 K/uL  ? nRBC 0.0 0.0 - 0.2 %  ? Neutrophils Relative % 65 %  ? Neutro Abs 4.1 1.7 - 7.7 K/uL  ? Lymphocytes Relative 24 %  ? Lymphs Abs 1.5 0.7 - 4.0 K/uL  ? Monocytes Relative 8 %  ? Monocytes Absolute 0.5 0.1 - 1.0 K/uL  ? Eosinophils Relative 2 %  ? Eosinophils Absolute 0.1 0.0 - 0.5 K/uL  ? Basophils Relative 1 %  ? Basophils Absolute 0.1 0.0 - 0.1 K/uL  ? Immature Granulocytes 0 %  ? Abs Immature Granulocytes 0.02 0.00 - 0.07 K/uL  ?  Comment: Performed at [facility], [address] [place], [place] [address]   ? ? ? ?RADIOGRAPHIC STUDIES: ?I have personally reviewed the radiological images as listed and agreed with the findings in the report. ?No results found. ? ?ASSESSMENT:  ?Elevated ferritin/hemochromatosis (C282Y) carrier sta

## 2022-01-24 ENCOUNTER — Encounter: Payer: Self-pay | Admitting: Family Medicine

## 2022-01-24 DIAGNOSIS — Z148 Genetic carrier of other disease: Secondary | ICD-10-CM

## 2022-01-24 HISTORY — DX: Genetic carrier of other disease: Z14.8

## 2022-02-08 ENCOUNTER — Ambulatory Visit: Payer: 59 | Admitting: "Endocrinology

## 2022-02-08 ENCOUNTER — Encounter: Payer: Self-pay | Admitting: "Endocrinology

## 2022-02-08 VITALS — BP 130/76 | HR 76 | Ht 65.0 in | Wt 138.6 lb

## 2022-02-08 DIAGNOSIS — E063 Autoimmune thyroiditis: Secondary | ICD-10-CM

## 2022-02-08 DIAGNOSIS — E782 Mixed hyperlipidemia: Secondary | ICD-10-CM | POA: Diagnosis not present

## 2022-02-08 MED ORDER — LEVOTHYROXINE SODIUM 25 MCG PO TABS
88.0000 ug | ORAL_TABLET | Freq: Every day | ORAL | 1 refills | Status: DC
Start: 2022-02-08 — End: 2022-06-23

## 2022-02-08 NOTE — Progress Notes (Signed)
? ?    Endocrinology Consult Note ?                                           02/08/2022, 5:21 PM ? ? ?Subjective:  ? ? Patient ID: Karina Heath, female    DOB: 19-May-1961, PCP Babs SciaraLuking, Scott A, MD ? ? ?Past Medical History:  ?Diagnosis Date  ? 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 Has been seen by Dr. Ellin SabaKatragadda.  They are monitoring.  No sign of iron overload.  Heterozygous.  ? Elevated ferritin 11/11/2021  ? Hemoglobin normal ferritin elevated  ? Erythema nodosum   ? GERD (gastroesophageal reflux disease)   ? Other specified disorders of thyroid   ? Inflamed thyroid  ? ?Past Surgical History:  ?Procedure Laterality Date  ? NO PAST SURGERIES    ? ?Social History  ? ?Socioeconomic History  ? Marital status: Married  ?  Spouse name: Not on file  ? Number of children: 2  ? Years of education: 2013  ? Highest education level: Not on file  ?Occupational History  ? Occupation: [employer]  ?Tobacco Use  ? Smoking status: Never  ? Smokeless tobacco: Never  ?Vaping Use  ? Vaping Use: Never used  ?Substance and Sexual Activity  ? Alcohol use: Yes  ?  Comment: occas glass of wine  ? Drug use: No  ? Sexual activity: Not on file  ?Other Topics Concern  ? Not on file  ?Social History Narrative  ? Lives at home w/ her husband  ? Right-handed  ? Caffeine: 2 cups coffee each morning, occasional glass of tea  ? ?Social Determinants of Health  ? ?Financial Resource Strain: Not on file  ?Food Insecurity: Not on file  ?Transportation Needs: Not on file  ?Physical Activity: Not on file  ?Stress: Not on file  ?Social Connections: Not on file  ? ?Family History  ?Problem Relation Age of Onset  ? Thyroid disease Mother   ? Heart attack Brother   ? Diabetes Brother   ? Heart failure Maternal Grandmother   ? Heart attack Maternal Grandfather   ? ?Outpatient Encounter Medications as of 02/08/2022  ?Medication Sig  ? levothyroxine (SYNTHROID) 25 MCG tablet Take 3.5 tablets (88 mcg total) by mouth daily before  breakfast.  ? Bacillus Coagulans-Inulin (PROBIOTIC) 1-250 BILLION-MG CAPS   ? cholecalciferol (VITAMIN D) 1000 units tablet Take 2,000 Units by mouth at bedtime.  ? Folic Acid-Vit B6-Vit B12 (FOLBEE) 2.5-25-1 MG TABS tablet Take 1 tablet by mouth as directed. Takes twice daily three times a week ?B6 2 mg/ b12(as Methylcobalamin) 5000 mcg/b12 (as hydroxycobalamin) 206000mcg/ Folate 5440mcg DFE  ? gabapentin (NEURONTIN) 100 MG capsule Take 1 capsule (100 mg total) by mouth 3 (three) times daily. (Patient not taking: Reported on 02/08/2022)  ? ibuprofen (ADVIL,MOTRIN) 200 MG tablet Take 400 mg by mouth every 6 (six) hours as needed for headache, mild pain or moderate pain.   ? Melatonin 10 MG TABS Take by mouth.  ? Naltrexone HCl, Pain, 1.5 MG CAPS Take by mouth.  ? [DISCONTINUED] itraconazole (SPORANOX) 100 MG capsule Take 100 mg by mouth 2 (two) times daily.  ? ?No facility-administered encounter medications on file as of 02/08/2022.  ? ?ALLERGIES: ?Allergies  ?Allergen Reactions  ? Latex Rash  ? Silicone Rash  ?  tapes  ? ? ?VACCINATION  STATUS: ?Immunization History  ?Administered Date(s) Administered  ? Influenza,inj,Quad PF,6+ Mos 07/28/2020  ? Moderna Sars-Covid-2 Vaccination 01/08/2020, 02/04/2020  ? ? ?HPI ?Karina Heath is 61 y.o. female who presents today with a medical history as above. she is being seen in consultation for Hashimoto's thyroiditis requested by Babs Sciara, MD.  ?History is obtained directly from the patient as well as chart review.  She reports that in 2018 she was diagnosed with Hashimoto's thyroiditis as well as multinodular goiter.  At that time she underwent what appears to be fine-needle aspiration of thyroid nodule with benign findings.  She is not currently on any thyroid supplements.   ?Her most recent labs show high titers of TPO antibodies, however TSH, free T4 and free T3 are within normal range.   ?She does not have acute complaints today.  She also has severe dyslipidemia not on  treatment.  She has history of hemochromatosis on hematology/oncology follow-up. ?She denies any other major medical problems.  She denies diabetes, hypertension, coronary disease,. ?She is a mother of 2 grown children, no grandchildren yet.  He is physically active, does not smoke cigarettes.  However she consumes alcohol socially. ? ? ?Review of Systems ? ?Constitutional: + Minimally fluctuating body weight, no fatigue, no subjective hyperthermia, no subjective hypothermia ?Eyes: no blurry vision, no xerophthalmia ?ENT: no sore throat, no nodules palpated in throat, no dysphagia/odynophagia, no hoarseness ?Cardiovascular: no Chest Pain, no Shortness of Breath, no palpitations, no leg swelling ?Respiratory: no cough, no shortness of breath ?Gastrointestinal: no Nausea/Vomiting/Diarhhea ?Musculoskeletal: no muscle/joint aches ?Skin: no rashes ?Neurological: no tremors, no numbness, no tingling, no dizziness ?Psychiatric: no depression, no anxiety ? ?Objective:  ?  ? ?  02/08/2022  ?  2:53 PM 01/22/2022  ? 10:08 AM 11/11/2021  ?  1:17 PM  ?Vitals with BMI  ?Height 5\' 5"  5\' 5"  5\' 5"   ?Weight 138 lbs 10 oz 138 lbs 13 oz 144 lbs 13 oz  ?BMI 23.06 23.1 24.1  ?Systolic 130 151  ?Diastolic 76 84 72  ?Pulse 76 77   ? ? ?BP 130/76   Pulse 76   Ht 5\' 5"  (1.651 m)   Wt 138 lb 9.6 oz (62.9 kg)   BMI 23.06 kg/m?   ?Wt Readings from Last 3 Encounters:  ?02/08/22 138 lb 9.6 oz (62.9 kg)  ?01/22/22 138 lb 12.8 oz (63 kg)  ?11/11/21 144 lb 12.8 oz (65.7 kg)  ?  ?Physical Exam ? ?Constitutional:  Body mass index is 23.06 kg/m?.,  not in acute distress, normal state of mind ?Eyes: PERRLA, EOMI, no exophthalmos ?ENT: moist mucous membranes, +gross thyromegaly, no gross cervical lymphadenopathy ?Cardiovascular: normal precordial activity, Regular Rate and Rhythm, no Murmur/Rubs/Gallops ?Respiratory:  adequate breathing efforts, no gross chest deformity, Clear to auscultation bilaterally ?Gastrointestinal: abdomen soft, Non -tender,  No distension, Bowel Sounds present, no gross organomegaly ?Musculoskeletal: no gross deformities, strength intact in all four extremities ?Skin: moist, warm, no rashes ?Neurological: no tremor with outstretched hands, Deep tendon reflexes normal in bilateral lower extremities. ? ?CMP ( most recent) ?CMP  ?   ?Component Value Date/Time  ? NA 140 07/10/2021 0933  ? K 4.8 07/10/2021 0933  ? CL 101 07/10/2021 0933  ? CO2 26 07/10/2021 0933  ? GLUCOSE 89 07/10/2021 0933  ? GLUCOSE 83 07/14/2016 1328  ? BUN 14 07/10/2021 0933  ? CREATININE 0.86 07/10/2021 0933  ? CALCIUM 9.4 07/10/2021 0933  ? PROT 7.4 01/22/2022 1033  ? PROT 7.0 08/22/2020  1003  ? ALBUMIN 4.2 01/22/2022 1033  ? ALBUMIN 4.6 08/22/2020 1003  ? AST 22 01/22/2022 1033  ? ALT 17 01/22/2022 1033  ? ALKPHOS 65 01/22/2022 1033  ? BILITOT 0.5 01/22/2022 1033  ? BILITOT 0.4 08/22/2020 1003  ? GFRNONAA 66 08/22/2020 1003  ? GFRAA 76 08/22/2020 1003  ? ? ?Lipid Panel ( most recent) ?Lipid Panel  ?   ?Component Value Date/Time  ? CHOL 237 (H) 07/10/2021 0933  ? TRIG 133 07/10/2021 0933  ? HDL 62 07/10/2021 0933  ? CHOLHDL 3.8 07/10/2021 0933  ? LDLCALC 151 (H) 07/10/2021 0933  ? LABVLDL 24 07/10/2021 0933  ? ?  ? ?Lab Results  ?Component Value Date  ? TSH 2.610 07/10/2021  ? TSH 2.730 11/15/2016  ? TSH 1.66 08/16/2016  ? FREET4 1.01 07/10/2021  ? FREET4 0.98 11/15/2016  ? FREET4 0.69 08/16/2016  ?  ? ? ?Assessment & Plan:  ? ?1. Hashimoto's thyroiditis  2.  Dyslipidemia ? ?- Karina Heath  is being seen at a kind request of Luking, Jonna Coup, MD. ?- I have reviewed her available  records and clinically evaluated the patient. ?- Based on these reviews, she has high titers of 1 desire antibodies indicating autoimmune thyroid disorder without thyroid dysfunction.  However, this patient would benefit from early initiation of low-dose thyroid hormone. ? ?I discussed changing to levothyroxine 25 mcg p.o. daily before breakfast. ? ? - We discussed about the correct intake of  her thyroid hormone, on empty stomach at fasting, with water, separated by at least 30 minutes from breakfast and other medications,  and separated by more than 4 hours from calcium, iron, multivitamins, acid r

## 2022-02-18 ENCOUNTER — Other Ambulatory Visit: Payer: Self-pay | Admitting: "Endocrinology

## 2022-02-23 ENCOUNTER — Encounter: Payer: Self-pay | Admitting: "Endocrinology

## 2022-02-23 LAB — HM MAMMOGRAPHY

## 2022-02-26 ENCOUNTER — Encounter: Payer: Self-pay | Admitting: Family Medicine

## 2022-04-27 ENCOUNTER — Ambulatory Visit (HOSPITAL_COMMUNITY): Payer: 59

## 2022-05-11 ENCOUNTER — Ambulatory Visit: Payer: 59 | Admitting: "Endocrinology

## 2022-05-12 ENCOUNTER — Encounter: Payer: Self-pay | Admitting: Family Medicine

## 2022-05-12 DIAGNOSIS — E063 Autoimmune thyroiditis: Secondary | ICD-10-CM

## 2022-05-12 DIAGNOSIS — E785 Hyperlipidemia, unspecified: Secondary | ICD-10-CM

## 2022-05-12 DIAGNOSIS — Z79899 Other long term (current) drug therapy: Secondary | ICD-10-CM

## 2022-05-12 DIAGNOSIS — Z1321 Encounter for screening for nutritional disorder: Secondary | ICD-10-CM

## 2022-05-13 NOTE — Telephone Encounter (Signed)
Nurses I was able to look at the electronic messaging from hematology oncology as well as endocrinology.  Dr. Fransico Him has ordered thyroid functions.  I would encourage S[place] to connect with Dr. Fransico Him regarding adding a TPO  I can also see that Dr. Ellin Saba has ordered the appropriate blood work that she mentioned in her note but it does not indicate when they want to have that drawn.  I would recommend that [facility] connect with Dr. Ellin Saba to confirm when they want to have that blood work drawn then all of that could be done together  As for [place] the areas that I would recommend is doing a CMP as well as vitamin D and lipid This is to check for vitamin D deficiency as well as she has underlying hyperlipidemia and also with the medication she is on she should have a CMP  Hopefully all of those could be drawn at the same time but I cannot tell from the notes when Dr. Fransico Him or Dr. Ellin Saba desires to have the additional blood work  Please help coordinate all of this with Verlon Au thank you so much-Dr. Lorin Picket

## 2022-05-17 NOTE — Telephone Encounter (Signed)
Nurses regarding her hypothyroidism you may order Thyroid peroxidase antibodies Free T4, T3, TSH  Also see previous message for other labs that we are ordering. The other labs that Dr. Ellin Saba ordered will be handled through them We will do the best we can at helping her with her thyroid condition if it exceeds our capabilities we will utilize different endocrinology but will discuss all this when she follows up. If it additional questions concerns regarding labs let me know thanks-Dr. Lorin Picket

## 2022-05-18 ENCOUNTER — Encounter: Payer: Self-pay | Admitting: Family Medicine

## 2022-06-03 ENCOUNTER — Encounter: Payer: Self-pay | Admitting: Family Medicine

## 2022-06-03 LAB — T4, FREE: Free T4: 1.23 ng/dL (ref 0.82–1.77)

## 2022-06-03 LAB — THYROID PEROXIDASE ANTIBODY: Thyroperoxidase Ab SerPl-aCnc: 258 IU/mL — ABNORMAL HIGH (ref 0–34)

## 2022-06-03 LAB — T3: T3, Total: 88 ng/dL (ref 71–180)

## 2022-06-03 LAB — TSH: TSH: 1.45 u[IU]/mL (ref 0.450–4.500)

## 2022-06-05 ENCOUNTER — Encounter: Payer: Self-pay | Admitting: Family Medicine

## 2022-06-09 NOTE — Telephone Encounter (Signed)
Nurses It is unlikely that her Hashimoto's is causing her insomnia  Please send/mail  Ranita the information packet regarding sleep hygiene.  Hopefully this would help with some of the insomnia symptoms.  Thanks-Dr. Lorin Picket

## 2022-06-23 ENCOUNTER — Ambulatory Visit: Payer: 59 | Admitting: "Endocrinology

## 2022-06-23 ENCOUNTER — Encounter: Payer: Self-pay | Admitting: "Endocrinology

## 2022-06-23 VITALS — BP 122/80 | HR 72 | Ht 65.0 in | Wt 138.6 lb

## 2022-06-23 DIAGNOSIS — E782 Mixed hyperlipidemia: Secondary | ICD-10-CM | POA: Diagnosis not present

## 2022-06-23 DIAGNOSIS — E038 Other specified hypothyroidism: Secondary | ICD-10-CM | POA: Insufficient documentation

## 2022-06-23 DIAGNOSIS — E063 Autoimmune thyroiditis: Secondary | ICD-10-CM | POA: Diagnosis not present

## 2022-06-23 MED ORDER — LEVOTHYROXINE SODIUM 25 MCG PO TABS: 25.0000 ug | ORAL_TABLET | Freq: Every day | ORAL | 1 refills | Status: DC

## 2022-06-23 NOTE — Progress Notes (Signed)
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 Ellin Saba.  They are monitoring.  No sign of iron overload.  Heterozygous.   Elevated ferritin 11/11/2021   Hemoglobin normal ferritin elevated   Erythema nodosum    GERD (gastroesophageal reflux disease)    Other specified disorders of thyroid    Inflamed thyroid   Past Surgical History:  Procedure Laterality Date   NO PAST SURGERIES     Social History   Socioeconomic History   Marital status: Married    Spouse name: Not on file   Number of children: 2   Years of education: 13   Highest education level: Not on file  Occupational History   Occupation: [employer]  Tobacco Use   Smoking status: Never   Smokeless tobacco: Never  Vaping Use   Vaping Use: Never used  Substance and Sexual Activity   Alcohol use: Yes    Comment: occas glass of wine   Drug use: No   Sexual activity: Not on file  Other Topics Concern   Not on file  Social History Narrative   Lives at home w/ her husband   Right-handed   Caffeine: 2 cups coffee each morning, occasional glass of tea   Social Determinants of Health   Financial Resource Strain: Not on file  Food Insecurity: Not on file  Transportation Needs: Not on file  Physical Activity: Not on file  Stress: Not on file  Social Connections: Not on file   Family History  Problem Relation Age of Onset   Thyroid disease Mother    Heart attack Brother    Diabetes Brother    Heart failure Maternal Grandmother    Heart attack Maternal Grandfather    Outpatient Encounter Medications as of 06/23/2022  Medication Sig   gabapentin (NEURONTIN) 100 MG capsule Take 1 capsule (100 mg total) by mouth 3 (three) times  daily. (Patient taking differently: Take 100 mg by mouth 3 (three) times daily as needed.)   [DISCONTINUED] levothyroxine (SYNTHROID) 25 MCG tablet Take 25 mcg by mouth daily before breakfast.   Bacillus Coagulans-Inulin (PROBIOTIC) 1-250 BILLION-MG CAPS    cholecalciferol (VITAMIN D) 1000 units tablet Take 2,000 Units by mouth at bedtime.   Folic Acid-Vit B6-Vit B12 (FOLBEE) 2.5-25-1 MG TABS tablet Take 1 tablet by mouth as directed. Takes twice daily three times a week B6 2 mg/ b12(as Methylcobalamin) 5000 mcg/b12 (as hydroxycobalamin) 2059mcg/ Folate DFE   ibuprofen (ADVIL,MOTRIN) 200 MG tablet Take 400 mg by mouth every 6 (six) hours as needed for headache, mild pain or moderate pain.    levothyroxine (SYNTHROID) 25 MCG tablet Take 1 tablet (25 mcg total) by mouth daily before breakfast.   Melatonin 10 MG TABS Take by mouth.   [DISCONTINUED] levothyroxine (SYNTHROID) 25 MCG tablet Take 3.5 tablets (88 mcg total) by mouth daily before breakfast. (Patient taking differently: Take 25 mcg by mouth daily before breakfast.)   [DISCONTINUED] Naltrexone HCl, Pain, 1.5  MG CAPS Take by mouth.   No facility-administered encounter medications on file as of 06/23/2022.   ALLERGIES: Allergies  Allergen Reactions   Latex Rash   Silicone Rash    tapes    VACCINATION STATUS: Immunization History  Administered Date(s) Administered   Influenza,inj,Quad PF,6+ Mos 07/28/2020   Moderna Sars-Covid-2 Vaccination 01/08/2020, 02/04/2020    HPI Karina Heath is 61 y.o. female who presents today to follow-up after she was seen in consultation last visit for hypothyroidism due to Hashimoto's thyroiditis.    PMD:  Babs Sciara, MD.  See notes from previous visit.  She was started on levothyroxine 25 mcg, continues to tolerate with correction of her thyroid function tests.   She reports that in 2018 she was diagnosed with Hashimoto's thyroiditis as well as multinodular goiter.  At that time she  underwent what appears to be fine-needle aspiration of thyroid nodule with benign findings.  She decided not to obtain thyroid ultrasound ordered during her last visit but wishes to get it before her next visit.  Her most recent labs showed persistently elevated titers of TPO antibodies.    She does not have acute complaints today.  She also has severe dyslipidemia not on treatment.  She has history of hemochromatosis on hematology/oncology follow-up. She denies any other major medical problems.  She denies diabetes, hypertension, coronary disease,. She is a mother of 2 grown children, no grandchildren yet.  she is physically active, does not smoke cigarettes.  However she consumes alcohol socially.   Review of Systems  Constitutional: + Steady weight since last visit, + fatigue, no subjective hyperthermia, no subjective hypothermia Eyes: no blurry vision, no xerophthalmia ENT: no sore throat, no nodules palpated in throat, no dysphagia/odynophagia, no hoarseness Cardiovascular: no Chest Pain, no Shortness of Breath, no palpitations, no leg swelling Respiratory: no cough, no shortness of breath Gastrointestinal: no Nausea/Vomiting/Diarhhea Musculoskeletal: no muscle/joint aches Skin: no rashes Neurological: no tremors, no numbness, no tingling, no dizziness Psychiatric: no depression, no anxiety  Objective:       06/23/2022   12:59 PM 02/08/2022    2:53 PM 01/22/2022   10:08 AM  Vitals with BMI  Height 5\' 5"  5\' 5"  5\' 5"   Weight 138 lbs 10 oz 138 lbs 10 oz 138 lbs 13 oz  BMI 23.06 23.06 23.1  Systolic 122 130  Diastolic 80 76 84  Pulse 72 76 77    BP 122/80   Pulse 72   Ht 5\' 5"  (1.651 m)   Wt 138 lb 9.6 oz (62.9 kg)   BMI 23.06 kg/m   Wt Readings from Last 3 Encounters:  06/23/22 138 lb 9.6 oz (62.9 kg)  02/08/22 138 lb 9.6 oz (62.9 kg)  01/22/22 138 lb 12.8 oz (63 kg)    Physical Exam  Constitutional:  Body mass index is 23.06 kg/m.,  not in acute distress, normal  state of mind Eyes: PERRLA, EOMI, no exophthalmos ENT: moist mucous membranes, +gross thyromegaly, no gross cervical lymphadenopathy Cardiovascular: normal precordial activity, Regular Rate and Rhythm, no Murmur/Rubs/Gallops Respiratory:  adequate breathing efforts, no gross chest deformity, Clear to auscultation bilaterally Gastrointestinal: abdomen soft, Non -tender, No distension, Bowel Sounds present, no gross organomegaly Musculoskeletal: no gross deformities, strength intact in all four extremities Skin: moist, warm, no rashes Neurological: no tremor with outstretched hands, Deep tendon reflexes normal in bilateral lower extremities.  CMP ( most recent) CMP     Component Value Date/Time   NA 140 07/10/2021 0933  K 4.8 07/10/2021 0933   CL 101 07/10/2021 0933   CO2 26 07/10/2021 0933   GLUCOSE 89 07/10/2021 0933   GLUCOSE 83 07/14/2016 1328   BUN 14 07/10/2021 0933   CREATININE 0.86 07/10/2021 0933   CALCIUM 9.4 07/10/2021 0933   PROT 7.4 01/22/2022 1033   PROT 7.0 08/22/2020 1003   ALBUMIN 4.2 01/22/2022 1033   ALBUMIN 4.6 08/22/2020 1003   AST 22 01/22/2022 1033   ALT 17 01/22/2022 1033   ALKPHOS 65 01/22/2022 1033   BILITOT 0.5 01/22/2022 1033   BILITOT 0.4 08/22/2020 1003   GFRNONAA 66 08/22/2020 1003   GFRAA 76 08/22/2020 1003    Lipid Panel ( most recent) Lipid Panel     Component Value Date/Time   CHOL 237 (H) 07/10/2021 0933   TRIG 133 07/10/2021 0933   HDL 62 07/10/2021 0933   CHOLHDL 3.8 07/10/2021 0933   LDLCALC 151 (H) 07/10/2021 0933   LABVLDL 24 07/10/2021 0933      Lab Results  Component Value Date   TSH 1.450 06/02/2022   TSH 2.610 07/10/2021   TSH 2.730 11/15/2016   TSH 1.66 08/16/2016   FREET4 1.23 06/02/2022   FREET4 1.01 07/10/2021   FREET4 0.98 11/15/2016   FREET4 0.69 08/16/2016      Assessment & Plan:   1.  Hypothyroidism due to Hashimoto's thyroiditis   2.  Dyslipidemia  Her previsit thyroid function tests are on target.   She will continue to benefit from low-dose levothyroxine.  I discussed and continued her dose the same at 25 mcg p.o. daily before breakfast.   - We discussed about the correct intake of her thyroid hormone, on empty stomach at fasting, with water, separated by at least 30 minutes from breakfast and other medications,  and separated by more than 4 hours from calcium, iron, multivitamins, acid reflux medications (PPIs). -Patient is made aware of the fact that thyroid hormone replacement is needed for life, dose to be adjusted by periodic monitoring of thyroid function tests.  She reports engaging in lifestyle medicine.  She did not have previsit lipid panel.  She still wishes to avoid statins. - she acknowledges that there is a room for improvement in her food and drink choices. - Suggestion is made for her to avoid simple carbohydrates  from her diet including Cakes, Sweet Desserts, Ice Cream, Soda (diet and regular), Sweet Tea, Candies, Chips, Cookies, Store Bought Juices, Alcohol , Artificial Sweeteners,  Coffee Creamer, and "Sugar-free" Products, Lemonade. This will help patient to have more stable blood glucose profile and potentially avoid unintended weight gain.  The following Lifestyle Medicine recommendations according to [employer]  [facility]( [facility]) were discussed and and offered to patient and she  agrees to start the journey:  A. Whole Foods, Plant-Based Nutrition comprising of fruits and vegetables, plant-based proteins, whole-grain carbohydrates was discussed in detail with the patient.   A list for source of those nutrients were also provided to the patient.  Patient will use only water or unsweetened tea for hydration. B.  The need to stay away from risky substances including alcohol, smoking; obtaining 7 to 9 hours of restorative sleep, at least 150 minutes of moderate intensity exercise weekly, the importance of healthy social connections,  and stress management  techniques were discussed. C.  A full color page of  Calorie density of various food groups per pound showing examples of each food groups was provided to the patient.   She is  encouraged to continue follow-up with hematology regarding her hemochromatosis.  She will have thyroid ultrasound, thyroid function test, lipid panel, vitamin D and vitamin B12 measured before her next visit.  - she is advised to maintain close follow up with Kathyrn Drown, MD for primary care needs.    I spent 22 minutes in the care of the patient today including review of labs from Thyroid Function, CMP, and other relevant labs ; imaging/biopsy records (current and previous including abstractions from other facilities); face-to-face time discussing  her lab results and symptoms, medications doses, her options of short and long term treatment based on the latest standards of care / guidelines;   and documenting the encounter.  [facility]  participated in the discussions, expressed understanding, and voiced agreement with the above plans.  All questions were answered to her satisfaction. she is encouraged to contact clinic should she have any questions or concerns prior to her return visit.   Follow up plan: Return in about 6 months (around 12/22/2022) for F/U with Pre-visit Labs, Thyroid / Neck Ultrasound.   Glade Lloyd, MD [facility] [address] [place], [place] [address] Phone: [phone]  Fax: [phone]     06/23/2022, 7:14 PM  This note was partially dictated with voice recognition software. Similar sounding words can be transcribed inadequately or may not  be corrected upon review.

## 2022-07-08 ENCOUNTER — Encounter: Payer: Self-pay | Admitting: Family Medicine

## 2022-07-09 ENCOUNTER — Encounter: Payer: Self-pay | Admitting: *Deleted

## 2022-07-19 ENCOUNTER — Inpatient Hospital Stay (HOSPITAL_COMMUNITY): Payer: 59

## 2022-07-26 ENCOUNTER — Inpatient Hospital Stay (HOSPITAL_COMMUNITY): Payer: 59 | Admitting: Hematology

## 2022-09-25 ENCOUNTER — Telehealth: Payer: 59

## 2022-10-20 ENCOUNTER — Encounter: Payer: Self-pay | Admitting: *Deleted

## 2022-12-11 LAB — LIPID PANEL
Chol/HDL Ratio: 2.9 ratio (ref 0.0–4.4)
Cholesterol, Total: 201 mg/dL — ABNORMAL HIGH (ref 100–199)
HDL: 70 mg/dL (ref >39)
LDL Chol Calc (NIH): 114 mg/dL — ABNORMAL HIGH (ref 0–99)
Triglycerides: 98 mg/dL (ref 0–149)
VLDL Cholesterol Cal: 17 mg/dL (ref 5–40)

## 2022-12-11 LAB — T3, FREE: T3, Free: 2.9 pg/mL (ref 2.0–4.4)

## 2022-12-11 LAB — TSH: TSH: 1.31 u[IU]/mL (ref 0.450–4.500)

## 2022-12-11 LAB — VITAMIN B12: Vitamin B-12: >2000 pg/mL — ABNORMAL HIGH (ref 232–1245)

## 2022-12-11 LAB — VITAMIN D 25 HYDROXY (VIT D DEFICIENCY, FRACTURES): Vit D, 25-Hydroxy: 111 ng/mL — ABNORMAL HIGH (ref 30.0–100.0)

## 2022-12-11 LAB — T4, FREE: Free T4: 1.14 ng/dL (ref 0.82–1.77)

## 2022-12-16 ENCOUNTER — Ambulatory Visit (HOSPITAL_COMMUNITY): Payer: 59

## 2022-12-16 ENCOUNTER — Encounter (HOSPITAL_COMMUNITY): Payer: Self-pay

## 2022-12-17 ENCOUNTER — Telehealth: Payer: Self-pay | Admitting: "Endocrinology

## 2022-12-17 NOTE — Telephone Encounter (Signed)
Pt stated she has done her blood work but does not want to do the ultrasound and just wanted you to be aware.

## 2022-12-23 ENCOUNTER — Encounter: Payer: Self-pay | Admitting: "Endocrinology

## 2022-12-23 ENCOUNTER — Ambulatory Visit: Payer: 59 | Admitting: "Endocrinology

## 2022-12-23 VITALS — BP 122/68 | HR 72 | Ht 65.0 in | Wt 140.4 lb

## 2022-12-23 DIAGNOSIS — E038 Other specified hypothyroidism: Secondary | ICD-10-CM

## 2022-12-23 DIAGNOSIS — E063 Autoimmune thyroiditis: Secondary | ICD-10-CM | POA: Diagnosis not present

## 2022-12-23 DIAGNOSIS — E782 Mixed hyperlipidemia: Secondary | ICD-10-CM

## 2022-12-23 NOTE — Progress Notes (Signed)
12/23/2022, 2:41 PM  Endocrinology follow-up note   Subjective:    Patient ID: Karina Heath, female    DOB: 02/16/1961, PCP Kathyrn Drown, MD   Past Medical History:  Diagnosis Date   Carrier of hemochromatosis HFE gene mutation 01/24/2022   Has been seen by Dr. Delton Coombes.  They are monitoring.  No sign of iron overload.  Heterozygous.   Elevated ferritin 11/11/2021   Hemoglobin normal ferritin elevated   Erythema nodosum    GERD (gastroesophageal reflux disease)    Other specified disorders of thyroid    Inflamed thyroid   Past Surgical History:  Procedure Laterality Date   NO PAST SURGERIES     Social History   Socioeconomic History   Marital status: Married    Spouse name: Not on file   Number of children: 2   Years of education: 13   Highest education level: Not on file  Occupational History   Occupation: [employer]  Tobacco Use   Smoking status: Never   Smokeless tobacco: Never  Vaping Use   Vaping Use: Never used  Substance and Sexual Activity   Alcohol use: Yes    Comment: occas glass of wine   Drug use: No   Sexual activity: Not on file  Other Topics Concern   Not on file  Social History Narrative   Lives at home w/ her husband   Right-handed   Caffeine: 2 cups coffee each morning, occasional glass of tea   Social Determinants of Health   Financial Resource Strain: Not on file  Food Insecurity: Not on file  Transportation Needs: Not on file  Physical Activity: Not on file  Stress: Not on file  Social Connections: Not on file   Family History  Problem Relation Age of Onset   Thyroid disease Mother    Heart attack Brother    Diabetes Brother    Heart failure Maternal Grandmother    Heart attack Maternal Grandfather    Outpatient Encounter Medications as of 12/23/2022  Medication Sig   Bacillus Coagulans-Inulin (PROBIOTIC) 1-250 BILLION-MG CAPS    cholecalciferol (VITAMIN D)  1000 units tablet Take 2,000 Units by mouth at bedtime.   Folic Acid-Vit [id number] (FOLBEE) 2.5-25-1 MG TABS tablet Take 1 tablet by mouth as directed. Takes twice daily three times a week B6 2 mg/ b12(as Methylcobalamin) 5000 mcg/b12 (as hydroxycobalamin) 2059mcg/ Folate 5462mcg DFE   gabapentin (NEURONTIN) 100 MG capsule Take 1 capsule (100 mg total) by mouth 3 (three) times daily. (Patient taking differently: Take 100 mg by mouth 3 (three) times daily as needed.)   ibuprofen (ADVIL,MOTRIN) 200 MG tablet Take 400 mg by mouth every 6 (six) hours as needed for headache, mild pain or moderate pain.    levothyroxine (SYNTHROID) 25 MCG tablet Take 1 tablet (25 mcg total) by mouth daily before breakfast.   Melatonin 10 MG TABS Take by mouth.   No facility-administered encounter medications on file as of 12/23/2022.   ALLERGIES: Allergies  Allergen Reactions   Latex Rash   Silicone Rash    tapes    VACCINATION STATUS: Immunization History  Administered Date(s) Administered   Influenza,inj,Quad PF,6+ Mos 07/28/2020   Moderna Sars-Covid-2 Vaccination 01/08/2020,  02/04/2020    HPI Karina Heath is 62 y.o. female who presents today to follow-up after she was seen in consultation last visit for hypothyroidism due to Hashimoto's thyroiditis.    PMD:  Kathyrn Drown, MD.  See notes from previous visit.  She remains on low-dose levothyroxine 25 mcg p.o. daily before breakfast, continues to tolerate.       She reports that in 2018 she was diagnosed with Hashimoto's thyroiditis as well as multinodular goiter.  At that time she underwent what appears to be fine-needle aspiration of thyroid nodule with benign findings.   She decided not to obtain thyroid ultrasound ordered during her last visit.  Her most recent labs showed persistently elevated titers of TPO antibodies.    She does not have acute complaints today.  She also has severe dyslipidemia not on treatment.  Her previsit labs show  significant improvement in her cholesterol profile with LDL dropping to 114 from 151.  She has history of hemochromatosis on hematology/oncology follow-up. She denies diabetes, hypertension, coronary disease.  Her previsit labs also show hypervitaminosis D and hypervitaminosis B12. She is a mother of 2 grown children, no grandchildren yet.  she is physically active, does not smoke cigarettes.  However she consumes alcohol socially.   Review of Systems  Constitutional: + Steady weight since last visit, + fatigue, no subjective hyperthermia, no subjective hypothermia Eyes: no blurry vision, no xerophthalmia ENT: no sore throat, no nodules palpated in throat, no dysphagia/odynophagia, no hoarseness   Objective:       12/23/2022    1:03 PM 06/23/2022   12:59 PM 02/08/2022    2:53 PM  Vitals with BMI  Height 5\' 5"  5\' 5"  5\' 5"   Weight 140 lbs 6 oz 138 lbs 10 oz 138 lbs 10 oz  BMI 23.36 0000000 0000000  Systolic 123XX123 [id number]  Diastolic 68 80 76  Pulse 72 72 76    BP 122/68   Pulse 72   Ht 5\' 5"  (1.651 m)   Wt 140 lb 6.4 oz (63.7 kg)   BMI 23.36 kg/m   Wt Readings from Last 3 Encounters:  12/23/22 140 lb 6.4 oz (63.7 kg)  06/23/22 138 lb 9.6 oz (62.9 kg)  02/08/22 138 lb 9.6 oz (62.9 kg)    Physical Exam  Constitutional:  Body mass index is 23.36 kg/m.,  not in acute distress, normal state of mind Eyes: PERRLA, EOMI, no exophthalmos ENT: moist mucous membranes, +gross thyromegaly, no gross cervical lymphadenopathy Cardiovascular: normal precordial activity, Regular Rate and Rhythm, no Murmur/Rubs/Gallops   CMP ( most recent) CMP     Component Value Date/Time   NA 140 07/10/2021 0933   K 4.8 07/10/2021 0933   CL 101 07/10/2021 0933   CO2 26 07/10/2021 0933   GLUCOSE 89 07/10/2021 0933   GLUCOSE 83 07/14/2016 1328   BUN 14 07/10/2021 0933   CREATININE 0.86 07/10/2021 0933   CALCIUM 9.4 07/10/2021 0933   PROT 7.4 01/22/2022 1033   PROT 7.0 08/22/2020 1003   ALBUMIN 4.2  01/22/2022 1033   ALBUMIN 4.6 08/22/2020 1003   AST 22 01/22/2022 1033   ALT 17 01/22/2022 1033   ALKPHOS 65 01/22/2022 1033   BILITOT 0.5 01/22/2022 1033   BILITOT 0.4 08/22/2020 1003   GFRNONAA 66 08/22/2020 1003   GFRAA 76 08/22/2020 1003    Lipid Panel ( most recent) Lipid Panel     Component Value Date/Time   CHOL 201 (H) 12/09/2022 1125   TRIG  98 12/09/2022 1125   HDL 70 12/09/2022 1125   CHOLHDL 2.9 12/09/2022 1125   LDLCALC 114 (H) 12/09/2022 1125   LABVLDL 17 12/09/2022 1125      Lab Results  Component Value Date   TSH 1.310 12/09/2022   TSH 1.450 06/02/2022   TSH 2.610 07/10/2021   TSH 2.730 11/15/2016   TSH 1.66 08/16/2016   FREET4 1.14 12/09/2022   FREET4 1.23 06/02/2022   FREET4 1.01 07/10/2021   FREET4 0.98 11/15/2016   FREET4 0.69 08/16/2016      Assessment & Plan:   1.  Hypothyroidism due to Hashimoto's thyroiditis   2.  Dyslipidemia  Her previsit thyroid function tests are on target.  She is advised to continue levothyroxine 25 mcg p.o. daily before breakfast.   - We discussed about the correct intake of her thyroid hormone, on empty stomach at fasting, with water, separated by at least 30 minutes from breakfast and other medications,  and separated by more than 4 hours from calcium, iron, multivitamins, acid reflux medications (PPIs). -Patient is made aware of the fact that thyroid hormone replacement is needed for life, dose to be adjusted by periodic monitoring of thyroid function tests.   She reports engaging in lifestyle medicine.  Her previsit lipid panel showed significant improvement.  She still wishes to avoid statins.     - Suggestion is made for her to avoid simple carbohydrates  from her diet including Cakes, Sweet Desserts, Ice Cream, Soda (diet and regular), Sweet Tea, Candies, Chips, Cookies, Store Bought Juices, Alcohol , Artificial Sweeteners,  Coffee Creamer, and "Sugar-free" Products, Lemonade. This will help patient to have more  stable blood glucose profile and potentially avoid unintended weight gain.  The following Lifestyle Medicine recommendations according to [place]  [facility]) were discussed and and offered to patient and she  agrees to start the journey:  A. Whole Foods, Plant-Based Nutrition comprising of fruits and vegetables, plant-based proteins, whole-grain carbohydrates was discussed in detail with the patient.   A list for source of those nutrients were also provided to the patient.  Patient will use only water or unsweetened tea for hydration. B.  The need to stay away from risky substances including alcohol, smoking; obtaining 7 to 9 hours of restorative sleep, at least 150 minutes of moderate intensity exercise weekly, the importance of healthy social connections,  and stress management techniques were discussed. C.  A full color page of  Calorie density of various food groups per pound showing examples of each food groups was provided to the patient.   She is encouraged to continue follow-up with hematology regarding her hemochromatosis.  She would like to defer her ultrasound of the thyroid.  She is advised to hold vitamin D and vitamin B12 supplements until next measurement.    - she is advised to maintain close follow up with Kathyrn Drown, MD for primary care needs.   I spent  22  minutes in the care of the patient today including review of labs from Thyroid Function, CMP, and other relevant labs ; imaging/biopsy records (current and previous including abstractions from other facilities); face-to-face time discussing  her lab results and symptoms, medications doses, her options of short and long term treatment based on the latest standards of care / guidelines;   and documenting the encounter.  [facility]  participated in the discussions, expressed understanding, and voiced agreement with the above plans.  All questions were answered to her satisfaction.  she is  encouraged to contact clinic should she have any questions or concerns prior to her return visit.   Follow up plan: Return in about 6 months (around 06/25/2023) for Fasting Labs  in AM B4 8.   Glade Lloyd, MD [facility] [address] [place], [place] [address] Phone: [phone]  Fax: [phone]     12/23/2022, 2:41 PM  This note was partially dictated with voice recognition software. Similar sounding words can be transcribed inadequately or may not  be corrected upon review.

## 2022-12-29 ENCOUNTER — Other Ambulatory Visit: Payer: Self-pay

## 2022-12-29 DIAGNOSIS — E038 Other specified hypothyroidism: Secondary | ICD-10-CM

## 2022-12-29 MED ORDER — LEVOTHYROXINE SODIUM 25 MCG PO TABS: 25.0000 ug | ORAL_TABLET | Freq: Every day | ORAL | 6 refills | Status: DC

## 2023-05-21 ENCOUNTER — Other Ambulatory Visit: Payer: Self-pay | Admitting: Family Medicine

## 2023-08-08 ENCOUNTER — Ambulatory Visit (INDEPENDENT_AMBULATORY_CARE_PROVIDER_SITE_OTHER): Payer: 59 | Admitting: Family Medicine

## 2023-08-08 VITALS — BP 110/68 | HR 76 | Temp 97.7°F | Ht 65.0 in | Wt 139.0 lb

## 2023-08-08 DIAGNOSIS — E049 Nontoxic goiter, unspecified: Secondary | ICD-10-CM

## 2023-08-08 DIAGNOSIS — E063 Autoimmune thyroiditis: Secondary | ICD-10-CM | POA: Diagnosis not present

## 2023-08-08 DIAGNOSIS — E042 Nontoxic multinodular goiter: Secondary | ICD-10-CM

## 2023-08-08 MED ORDER — LEVOTHYROXINE SODIUM 25 MCG PO TABS: ORAL_TABLET | ORAL | 6 refills | Status: DC

## 2023-08-08 NOTE — Patient Instructions (Signed)

## 2023-08-08 NOTE — Progress Notes (Signed)
   Subjective:    Patient ID: Karina Heath, female    DOB: August 22, 1961, 62 y.o.   MRN: [id number]  Discussed the use of AI scribe software for clinical note transcription with the patient, who gave verbal consent to proceed.  History of Present Illness   The patient, with a known diagnosis of Hashimoto's thyroiditis, presents with ongoing sleep disturbances. She reports difficulty falling asleep, which occurs one to two nights per week. The patient has been using melatonin approximately once a week, which she reports as helpful. However, she expresses a desire to avoid additional sleep aids. Despite these sleep issues, the patient maintains an active lifestyle, aiming for at least 7,000 steps per day [place].  The patient also reports persistent fatigue, which she describes as feeling "weighted down" and "weepy eyed." She expresses concern that this may be related to her current dose of levothyroxine (25 micrograms daily), prescribed for her Hashimoto's thyroiditis. She has previously requested an increase in dosage from her endocrinologist, but this was declined due to her blood work being within the normal range.  The patient has discontinued gabapentin, which was initially prescribed for unspecified issues. She reports that these issues have largely resolved since starting levothyroxine. She also takes vitamin D3 with K, but has recently reduced her intake due to high vitamin D levels in her blood work. She expresses a desire to have her thyroid peroxidase antibody levels rechecked, as she has noticed a decrease in these levels since starting levothyroxine.  The patient also mentions a prominence in her neck, which was recently noticed by her children. She recalls having a thyroid biopsy approximately five years ago, which was reportedly normal. She expresses willingness to undergo an ultrasound for further evaluation of this prominence.  In summary, the patient's primary concerns are her  ongoing sleep disturbances and persistent fatigue, which she suspects may be related to her current levothyroxine dosage. She also expresses concern about a noticeable prominence in her neck.         Review of Systems     Objective:    Physical Exam   NECK: Slight prominence on right side of thyroid gland.    General-in no acute distress Eyes-no discharge Lungs-respiratory rate normal, CTA CV-no murmurs,RRR  skin warm dry Neuro grossly normal alert        Assessment & Plan:  Assessment and Plan    Insomnia Difficulty falling asleep, with occasional use of melatonin. No desire for additional sleep aids. -Continue current sleep hygiene practices and occasional melatonin use.  Hashimoto's Thyroiditis On Levothyroxine daily. Discussed potential increase in dose to address fatigue. -Increase Levothyroxine to one day per week, continue on other days. -Repeat thyroid function tests in January 2025.  Vitamin D Supplementation Previously high levels, now taking Vitamin D3 every three days. -Continue current regimen. -Check Vitamin D level in January 2025.  Vitamin B12 Supplementation Previously on methylation complete, now discontinued. -Check Vitamin B12 level in January 2025.  Thyroid Nodule Palpable prominence on right side of thyroid on physical exam. -Order thyroid ultrasound in 60 days.  Influenza Vaccination Discussed importance of annual vaccination. -Administer influenza vaccine today.  Shingles Vaccination Discussed benefits and potential side effects of vaccination. -Consider Shingles vaccination at pharmacy.

## 2023-08-17 ENCOUNTER — Encounter: Payer: Self-pay | Admitting: Family Medicine

## 2023-08-17 ENCOUNTER — Ambulatory Visit (HOSPITAL_COMMUNITY)
Admission: RE | Admit: 2023-08-17 | Discharge: 2023-08-17 | Disposition: A | Discharge status: Home / Self Care | Payer: 59 | Source: Ambulatory Visit | Attending: Family Medicine | Admitting: Family Medicine

## 2023-08-17 DIAGNOSIS — E049 Nontoxic goiter, unspecified: Secondary | ICD-10-CM | POA: Insufficient documentation

## 2023-08-17 DIAGNOSIS — E063 Autoimmune thyroiditis: Secondary | ICD-10-CM | POA: Insufficient documentation

## 2023-08-28 ENCOUNTER — Encounter: Payer: Self-pay | Admitting: Family Medicine

## 2023-10-30 ENCOUNTER — Encounter: Payer: Self-pay | Admitting: Family Medicine

## 2023-11-01 ENCOUNTER — Other Ambulatory Visit: Payer: Self-pay

## 2023-11-01 DIAGNOSIS — E063 Autoimmune thyroiditis: Secondary | ICD-10-CM

## 2023-11-01 DIAGNOSIS — E782 Mixed hyperlipidemia: Secondary | ICD-10-CM

## 2023-11-01 DIAGNOSIS — E559 Vitamin D deficiency, unspecified: Secondary | ICD-10-CM

## 2023-11-01 DIAGNOSIS — E538 Deficiency of other specified B group vitamins: Secondary | ICD-10-CM

## 2023-11-01 NOTE — Telephone Encounter (Signed)
Nurses Please connect with Yola  I would recommend TSH, free T4, T3  A long time ago she used to have a thyroid peroxidase antibody but that particular test is really not consequential to her current situation being on levothyroxine so we typically do not recommend checking that test  It is also been almost 1 year since she is having cholesterol profile therefore I recommend lipid profile  May also check vitamin D and B12 level  Diagnosis Hypothyroidism B12 deficiency Vitamin D deficiency Hyperlipidemia  Unless having issues her next follow-up visit would be approximately May or early June sooner if she would like thanks

## 2023-11-04 ENCOUNTER — Encounter: Payer: Self-pay | Admitting: Family Medicine

## 2023-11-04 LAB — LIPID PANEL
Chol/HDL Ratio: 3.3 {ratio} (ref 0.0–4.4)
Cholesterol, Total: 232 mg/dL — ABNORMAL HIGH (ref 100–199)
HDL: 71 mg/dL (ref >39)
LDL Chol Calc (NIH): 137 mg/dL — ABNORMAL HIGH (ref 0–99)
Triglycerides: 137 mg/dL (ref 0–149)
VLDL Cholesterol Cal: 24 mg/dL (ref 5–40)

## 2023-11-04 LAB — TSH+FREE T4
Free T4: 1.17 ng/dL (ref 0.82–1.77)
TSH: 1.17 u[IU]/mL (ref 0.450–4.500)

## 2023-11-04 LAB — VITAMIN B12: Vitamin B-12: 1005 pg/mL (ref 232–1245)

## 2023-11-04 LAB — VITAMIN D 25 HYDROXY (VIT D DEFICIENCY, FRACTURES): Vit D, 25-Hydroxy: 47.4 ng/mL (ref 30.0–100.0)

## 2023-11-04 LAB — T3: T3, Total: 93 ng/dL (ref 71–180)

## 2023-11-05 ENCOUNTER — Encounter: Payer: Self-pay | Admitting: Family Medicine

## 2023-11-07 ENCOUNTER — Other Ambulatory Visit: Payer: Self-pay

## 2023-11-07 ENCOUNTER — Telehealth: Payer: Self-pay

## 2023-11-07 DIAGNOSIS — Z139 Encounter for screening, unspecified: Secondary | ICD-10-CM

## 2023-11-07 NOTE — Telephone Encounter (Signed)
Cologuard order placed.

## 2023-11-07 NOTE — Telephone Encounter (Signed)
Nurses Please go ahead with referral request for Cologuard to be sent to her thank you

## 2023-11-28 ENCOUNTER — Encounter: Payer: Self-pay | Admitting: Family Medicine

## 2023-11-29 ENCOUNTER — Other Ambulatory Visit: Payer: Self-pay

## 2023-11-29 DIAGNOSIS — Z1211 Encounter for screening for malignant neoplasm of colon: Secondary | ICD-10-CM

## 2023-11-29 NOTE — Telephone Encounter (Signed)
 Nurses Please see previous message regarding the Cologuard Apparently what ever code was sent in was not the one that Cologuard wants Please work with [employer] company to find the correct code for screening and send it with an order for Cologuard Please keep patient informed

## 2023-12-10 LAB — COLOGUARD: COLOGUARD: NEGATIVE

## 2023-12-11 ENCOUNTER — Encounter: Payer: Self-pay | Admitting: Family Medicine

## 2024-02-02 ENCOUNTER — Other Ambulatory Visit (HOSPITAL_BASED_OUTPATIENT_CLINIC_OR_DEPARTMENT_OTHER): Payer: Self-pay | Admitting: Family Medicine

## 2024-02-02 DIAGNOSIS — Z1231 Encounter for screening mammogram for malignant neoplasm of breast: Secondary | ICD-10-CM

## 2024-02-28 ENCOUNTER — Ambulatory Visit (HOSPITAL_BASED_OUTPATIENT_CLINIC_OR_DEPARTMENT_OTHER)
Admission: RE | Admit: 2024-02-28 | Discharge: 2024-02-28 | Disposition: A | Discharge status: Home / Self Care | Source: Ambulatory Visit | Attending: Family Medicine | Admitting: Family Medicine

## 2024-02-28 ENCOUNTER — Encounter (HOSPITAL_BASED_OUTPATIENT_CLINIC_OR_DEPARTMENT_OTHER): Payer: Self-pay | Admitting: Radiology

## 2024-02-28 DIAGNOSIS — Z1231 Encounter for screening mammogram for malignant neoplasm of breast: Secondary | ICD-10-CM | POA: Insufficient documentation

## 2024-03-06 ENCOUNTER — Other Ambulatory Visit: Payer: Self-pay | Admitting: Family Medicine

## 2024-03-06 DIAGNOSIS — E063 Autoimmune thyroiditis: Secondary | ICD-10-CM

## 2024-03-07 ENCOUNTER — Encounter: Payer: Self-pay | Admitting: Obstetrics & Gynecology

## 2024-03-07 ENCOUNTER — Ambulatory Visit: Admitting: Obstetrics & Gynecology

## 2024-03-07 ENCOUNTER — Other Ambulatory Visit (HOSPITAL_COMMUNITY)
Admission: RE | Admit: 2024-03-07 | Discharge: 2024-03-07 | Disposition: A | Discharge status: Home / Self Care | Source: Ambulatory Visit | Attending: Obstetrics & Gynecology | Admitting: Obstetrics & Gynecology

## 2024-03-07 VITALS — BP 129/74 | HR 74 | Ht 64.0 in | Wt 140.8 lb

## 2024-03-07 DIAGNOSIS — Z01419 Encounter for gynecological examination (general) (routine) without abnormal findings: Secondary | ICD-10-CM

## 2024-03-07 DIAGNOSIS — Z87440 Personal history of urinary (tract) infections: Secondary | ICD-10-CM

## 2024-03-07 DIAGNOSIS — Z780 Asymptomatic menopausal state: Secondary | ICD-10-CM | POA: Diagnosis not present

## 2024-03-07 DIAGNOSIS — Z1151 Encounter for screening for human papillomavirus (HPV): Secondary | ICD-10-CM | POA: Diagnosis not present

## 2024-03-07 NOTE — Progress Notes (Signed)
 WELL-WOMAN EXAMINATION Patient name: Karina Heath MRN [id number]  Date of birth: [birth year] Chief Complaint:   Gynecologic Exam  History of Present Illness:   Karina Heath is a 63 y.o. G2P2000 PM female being seen today for a routine well-woman exam.   -previously seen @ [facility]- Dr. Tedra Fears. []  plan to obtain records Per pt screening up to date  She also reports a history of postcoital urinary tract infection.  It sounds as though she has tried compound treatment in the past could not quite recall which medications and did not have improvement.  She currently takes Macrobid as needed and notes that she has this prescription is home  No LMP recorded. Patient is postmenopausal. Denies issues with her menses The current method of family planning is post menopausal status.    Last pap due today.  Last mammogram: 02/2024 normal . Last colonoscopy: 03/2024     03/07/2024    1:28 PM 08/08/2023    1:14 PM 11/11/2021    1:19 PM 07/28/2020    3:03 PM  Depression screen PHQ 2/9  Decreased Interest 0 0 0 0  Down, Depressed, Hopeless 0 0 0 0  PHQ - 2 Score 0 0 0 0  Altered sleeping 0 2    Tired, decreased energy 0 2    Change in appetite 0 0    Feeling bad or failure about yourself  0 0    Trouble concentrating 0 0    Moving slowly or fidgety/restless 0 0    Suicidal thoughts 0 0    PHQ-9 Score 0 4    Difficult doing work/chores  Not difficult at all        Review of Systems:   Pertinent items are noted in HPI Denies any headaches, blurred vision, fatigue, shortness of breath, chest pain, abdominal pain, bowel movements, urination, or intercourse unless otherwise stated above.  Pertinent History Reviewed:  Reviewed past medical,surgical, social and family history.  Reviewed problem list, medications and allergies. Physical Assessment:   Vitals:   03/07/24 1323  BP: 129/74  Pulse: 74  Weight: 140 lb 12.8 oz (63.9 kg)  Height: 5\' 4"  (1.626 m)  Body mass index is  24.17 kg/m.        Physical Examination:   General appearance - well appearing, and in no distress  Mental status - alert, oriented to person, place, and time  Psych:  She has a normal mood and affect  Skin - warm and dry, normal color, no suspicious lesions noted  Chest - effort normal, all lung fields clear to auscultation bilaterally  Heart - normal rate and regular rhythm  Neck:  midline trachea, no thyromegaly or nodules  Breasts - breasts appear normal, no suspicious masses, no skin or nipple changes or  axillary nodes  Abdomen - soft, nontender, nondistended, no masses or organomegaly  Pelvic - VULVA: normal appearing vulva with no masses, tenderness or lesions  VAGINA: normal appearing vagina with normal color and discharge, no lesions  CERVIX: normal appearing cervix without discharge or lesions, no CMT  Thin prep pap is done with HR HPV cotesting  UTERUS: uterus is felt to be normal size, shape, consistency and nontender   ADNEXA: No adnexal masses or tenderness noted.  Extremities:  No swelling or varicosities noted  Chaperone: Lorean Rodes     Assessment & Plan:  1) Well-Woman Exam - Pap collected, reviewed ASCCP guidelines - Mammogram and colon cancer screening up-to-date  2) Postcoital UTI - Patient  taking Macrobid as needed as previously prescribed by Dr. Tedra Fears []  pt to call if refill needed  No orders of the defined types were placed in this encounter.   Meds: No orders of the defined types were placed in this encounter.   Follow-up: Return in about 1 year (around 03/07/2025) for Annual.   Deshonna Trnka, DO Attending Obstetrician & Gynecologist, [facility] for [facility], [facility]

## 2024-03-09 ENCOUNTER — Ambulatory Visit: Payer: Self-pay | Admitting: Obstetrics & Gynecology

## 2024-03-09 LAB — CYTOLOGY - PAP
Comment: NEGATIVE
Diagnosis: NEGATIVE
High risk HPV: NEGATIVE

## 2024-04-03 ENCOUNTER — Other Ambulatory Visit: Payer: Self-pay | Admitting: Family Medicine

## 2024-04-03 DIAGNOSIS — E063 Autoimmune thyroiditis: Secondary | ICD-10-CM

## 2024-04-27 ENCOUNTER — Other Ambulatory Visit: Payer: Self-pay | Admitting: Family Medicine

## 2024-04-27 DIAGNOSIS — E063 Autoimmune thyroiditis: Secondary | ICD-10-CM

## 2024-05-24 ENCOUNTER — Other Ambulatory Visit: Payer: Self-pay | Admitting: Family Medicine

## 2024-05-24 DIAGNOSIS — E063 Autoimmune thyroiditis: Secondary | ICD-10-CM

## 2024-06-20 ENCOUNTER — Other Ambulatory Visit: Payer: Self-pay | Admitting: Family Medicine

## 2024-06-20 DIAGNOSIS — E063 Autoimmune thyroiditis: Secondary | ICD-10-CM

## 2024-07-19 ENCOUNTER — Encounter: Payer: Self-pay | Admitting: Family Medicine

## 2024-08-06 ENCOUNTER — Encounter: Payer: Self-pay | Admitting: Physician Assistant

## 2024-08-06 ENCOUNTER — Ambulatory Visit: Payer: Self-pay | Admitting: Physician Assistant

## 2024-08-06 VITALS — BP 137/78 | HR 82 | Temp 97.3°F | Wt 144.0 lb

## 2024-08-06 DIAGNOSIS — E063 Autoimmune thyroiditis: Secondary | ICD-10-CM

## 2024-08-06 MED ORDER — LEVOTHYROXINE SODIUM 25 MCG PO TABS: ORAL_TABLET | ORAL | 0 refills | Status: DC

## 2024-08-06 NOTE — Progress Notes (Signed)
 Established Patient Office Visit  Subjective   Patient ID: Karina Heath, female    DOB: January 01, 1961  Age: 63 y.o. MRN: [id number]  Chief Complaint  Patient presents with   Hypothyroidism    Medication refill     Patient presents today for medication refill. Patient current taking 25 mcg of levothyroxine  daily with 50 mcg on Sunday. She relates feeling well overall. She endorses increased fatigue and irritability. She states she is unsure if fatigue is related to thyroid  disorder or recent insomnia. She has been following good sleep hygiene and night routine. She takes melatonin a couple times a week for sleep. She denies associated thyroid  symptoms and has been on this dose for a good amount of time. She does not have additional concerns or complaints at this time.    Review of Systems  Constitutional:  Positive for malaise/fatigue. Negative for chills and fever.  Eyes:  Negative for blurred vision and double vision.  Respiratory:  Negative for cough and shortness of breath.   Cardiovascular:  Negative for chest pain and palpitations.  Gastrointestinal:  Negative for constipation.  Neurological:  Negative for dizziness and headaches.  Psychiatric/Behavioral:  Negative for depression. The patient has insomnia. The patient is not nervous/anxious.       Objective:     BP 137/78   Pulse 82   Temp (!) 97.3 F (36.3 C)   Wt 144 lb (65.3 kg)   SpO2 97%   BMI 24.72 kg/m    Physical Exam Constitutional:      General: She is not in acute distress.    Appearance: Normal appearance. She is normal weight. She is not ill-appearing.  HENT:     Head: Normocephalic and atraumatic.     Mouth/Throat:     Mouth: Mucous membranes are moist.     Pharynx: Oropharynx is clear.  Eyes:     Extraocular Movements: Extraocular movements intact.     Conjunctiva/sclera: Conjunctivae normal.  Cardiovascular:     Rate and Rhythm: Normal rate and regular rhythm.     Heart sounds: Normal heart  sounds. No murmur heard. Pulmonary:     Effort: Pulmonary effort is normal.     Breath sounds: Normal breath sounds.  Musculoskeletal:     Right lower leg: No edema.     Left lower leg: No edema.  Skin:    General: Skin is warm and dry.  Neurological:     General: No focal deficit present.     Mental Status: She is alert and oriented to person, place, and time.  Psychiatric:        Mood and Affect: Mood normal.        Behavior: Behavior normal.     No results found for any visits on 08/06/24.  The 10-year ASCVD risk score (Arnett DK, et al., 2019) is: 4.9%    Assessment & Plan:   Return in about 1 year (around 08/06/2025) for thyroid  .   Hypothyroidism due to Hashimoto's thyroiditis Assessment & Plan: Doing well with current management. Update thyroid  studies today. Medication refilled. Encouraged sleep hygiene and regular physical activity. Discussed melatonin earlier in the evening for increased efficacy. Follow up yearly for management of Hashimoto's or sooner as needed.   Orders: -     TSH + free T4 -     Levothyroxine  Sodium; TAKE 2 TABLETS BY MOUTH ONCE DAILY ON  SUNDAY  THEN  1  TABLET  ONCE  DAILY  ON  ALL  OTHER  DAYS.  Dispense: 34 tablet; Refill: 0    Garrus Gauthreaux, PA-C

## 2024-08-06 NOTE — Assessment & Plan Note (Signed)
 Doing well with current management. Update thyroid  studies today. Medication refilled. Encouraged sleep hygiene and regular physical activity. Discussed melatonin earlier in the evening for increased efficacy. Follow up yearly for management of Hashimoto's or sooner as needed.

## 2024-08-07 ENCOUNTER — Ambulatory Visit: Payer: Self-pay | Admitting: Physician Assistant

## 2024-08-07 LAB — TSH+FREE T4
Free T4: 1.18 ng/dL (ref 0.82–1.77)
TSH: 0.953 u[IU]/mL (ref 0.450–4.500)

## 2024-08-08 ENCOUNTER — Encounter: Payer: Self-pay | Admitting: Physician Assistant

## 2024-08-14 ENCOUNTER — Encounter: Payer: Self-pay | Admitting: Obstetrics & Gynecology

## 2024-08-14 ENCOUNTER — Encounter: Payer: Self-pay | Admitting: Physician Assistant

## 2024-08-15 ENCOUNTER — Other Ambulatory Visit: Payer: Self-pay | Admitting: Obstetrics & Gynecology

## 2024-08-15 ENCOUNTER — Other Ambulatory Visit: Payer: Self-pay | Admitting: Physician Assistant

## 2024-08-15 DIAGNOSIS — R6882 Decreased libido: Secondary | ICD-10-CM

## 2024-08-15 DIAGNOSIS — N390 Urinary tract infection, site not specified: Secondary | ICD-10-CM

## 2024-08-15 DIAGNOSIS — E063 Autoimmune thyroiditis: Secondary | ICD-10-CM

## 2024-08-15 MED ORDER — LEVOTHYROXINE SODIUM 25 MCG PO TABS: ORAL_TABLET | ORAL | 3 refills | Status: DC

## 2024-08-15 MED ORDER — NITROFURANTOIN MONOHYD MACRO 100 MG PO CAPS: 100.0000 mg | ORAL_CAPSULE | Freq: Once | ORAL | 3 refills | Status: AC | PRN

## 2024-08-15 MED ORDER — PROGESTERONE MICRONIZED 100 MG PO CAPS: 100.0000 mg | ORAL_CAPSULE | Freq: Every evening | ORAL | 11 refills | Status: AC

## 2024-08-15 NOTE — Progress Notes (Signed)
 Called pt regarding UTI prophylaxis with macrobid suppression Rx sent in  Also discussed decreased libido- pt previously on several creams, which didn't seem to help. States she also tried progesterone, and DHEA supplement which seemed to help the best.  Rx for prometrium to take daily sent in and encouraged pt to try to find old prescription to confirm same medication []  f/u in 3 mos  Cherye Gaertner, DO Attending Obstetrician & Gynecologist, [facility] for [employer], [facility]

## 2024-09-23 ENCOUNTER — Other Ambulatory Visit: Payer: Self-pay | Admitting: Physician Assistant

## 2024-09-23 ENCOUNTER — Encounter: Payer: Self-pay | Admitting: Physician Assistant

## 2024-09-23 DIAGNOSIS — E063 Autoimmune thyroiditis: Secondary | ICD-10-CM

## 2024-09-24 ENCOUNTER — Other Ambulatory Visit: Payer: Self-pay | Admitting: Physician Assistant

## 2024-09-24 DIAGNOSIS — E063 Autoimmune thyroiditis: Secondary | ICD-10-CM

## 2024-09-24 MED ORDER — LEVOTHYROXINE SODIUM 25 MCG PO TABS: ORAL_TABLET | ORAL | 2 refills | Status: DC

## 2024-10-18 ENCOUNTER — Other Ambulatory Visit: Payer: Self-pay

## 2024-10-18 DIAGNOSIS — E063 Autoimmune thyroiditis: Secondary | ICD-10-CM

## 2024-10-18 MED ORDER — LEVOTHYROXINE SODIUM 25 MCG PO TABS: ORAL_TABLET | ORAL | 2 refills | Status: AC
